# Patient Record
Sex: Female | Born: 1990 | Race: Black or African American | Hispanic: No | Marital: Single | Smoking: Former smoker
Health system: Southern US, Community
[De-identification: ages and names within clinical notes are randomized; demographics above are authoritative.]

## PROBLEM LIST (undated history)

## (undated) ENCOUNTER — Inpatient Hospital Stay (HOSPITAL_COMMUNITY): Payer: Self-pay

## (undated) DIAGNOSIS — K219 Gastro-esophageal reflux disease without esophagitis: Secondary | ICD-10-CM

## (undated) DIAGNOSIS — D649 Anemia, unspecified: Secondary | ICD-10-CM

## (undated) HISTORY — DX: Anemia, unspecified: D64.9

## (undated) HISTORY — DX: Gastro-esophageal reflux disease without esophagitis: K21.9

## (undated) HISTORY — PX: NO PAST SURGERIES: SHX2092

---
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
## 2011-12-05 ENCOUNTER — Emergency Department (HOSPITAL_COMMUNITY)
Admission: EM | Admit: 2011-12-05 | Discharge: 2011-12-05 | Disposition: A | Discharge status: Home / Self Care | Payer: Self-pay | Attending: Emergency Medicine | Admitting: Emergency Medicine

## 2011-12-05 ENCOUNTER — Encounter (HOSPITAL_COMMUNITY): Payer: Self-pay | Admitting: Nurse Practitioner

## 2011-12-05 DIAGNOSIS — M545 Low back pain, unspecified: Secondary | ICD-10-CM | POA: Insufficient documentation

## 2011-12-05 DIAGNOSIS — R51 Headache: Secondary | ICD-10-CM | POA: Insufficient documentation

## 2011-12-05 DIAGNOSIS — F17200 Nicotine dependence, unspecified, uncomplicated: Secondary | ICD-10-CM | POA: Insufficient documentation

## 2011-12-05 DIAGNOSIS — Z87440 Personal history of urinary (tract) infections: Secondary | ICD-10-CM | POA: Insufficient documentation

## 2011-12-05 LAB — URINALYSIS, ROUTINE W REFLEX MICROSCOPIC
Glucose, UA: NEGATIVE mg/dL
Protein, ur: NEGATIVE mg/dL
Specific Gravity, Urine: 1.016 (ref 1.005–1.030)
Urobilinogen, UA: 0.2 mg/dL (ref 0.0–1.0)

## 2011-12-05 LAB — URINE MICROSCOPIC-ADD ON

## 2011-12-05 MED ORDER — IBUPROFEN 400 MG PO TABS
800.0000 mg | ORAL_TABLET | Freq: Once | ORAL | Status: AC
  Administered 2011-12-05: 800 mg via ORAL
  Filled 2011-12-05: qty 2

## 2011-12-05 MED ORDER — OXYCODONE-ACETAMINOPHEN 5-325 MG PO TABS: 1.0000 | ORAL_TABLET | Freq: Four times a day (QID) | ORAL | Status: DC | PRN

## 2011-12-05 MED ORDER — CYCLOBENZAPRINE HCL 5 MG PO TABS: 5.0000 mg | ORAL_TABLET | Freq: Three times a day (TID) | ORAL | Status: DC | PRN

## 2011-12-05 MED ORDER — OXYCODONE-ACETAMINOPHEN 5-325 MG PO TABS
1.0000 | ORAL_TABLET | Freq: Once | ORAL | Status: AC
  Administered 2011-12-05: 1 via ORAL
  Filled 2011-12-05: qty 1

## 2011-12-05 MED ORDER — IBUPROFEN 600 MG PO TABS: 600.0000 mg | ORAL_TABLET | Freq: Four times a day (QID) | ORAL | Status: DC | PRN

## 2011-12-05 NOTE — ED Provider Notes (Signed)
History   This chart was scribed for Ethelda Chick, MD by Melba Coon, ED Scribe. The patient was seen in room TR10C/TR10C and the patient's care was started at 1:48PM.    CSN: [id number]  Arrival date & time 12/05/11  1306   None     Chief Complaint  Patient presents with  . Migraine  . Back Pain    (Consider location/radiation/quality/duration/timing/severity/associated sxs/prior treatment) Patient is a 21 y.o. female presenting with back pain and headaches. The history is provided by the patient. No language interpreter was used.  Back Pain  This is a new problem. The current episode started 2 days ago. The problem has not changed since onset.The pain is associated with no known injury. The pain is present in the lumbar spine. The symptoms are aggravated by bending, twisting and certain positions. Associated symptoms include headaches.  Headache  The current episode started 2 days ago. The problem occurs constantly. The problem has not changed since onset.The headache is associated with nothing. The quality of the pain is described as dull and throbbing. The pain is moderate. She has tried acetaminophen (and ibuprofen) for the symptoms. The treatment provided no relief.  She believes that she may have a UTI; back pain is similar to her past UTIs. Back pain started first then her headache. She reports nausea and emesis. She denies fever, neck pain, sore throat, rash, CP, SOB, abdominal pain, diarrhea, dysuria, bowel or bladder dysfunction, or extremity pain, edema, weakness, numbness, or tingling. No IV drug abuse. No known allergies. No other pertinent medical symptoms.  No past medical history on file.  No past surgical history on file.  No family history on file.  History  Substance Use Topics  . Smoking status: Current Every Day Smoker  . Smokeless tobacco: Not on file  . Alcohol Use: No    OB History    Grav Para Term Preterm Abortions TAB SAB Ect Mult Living              Review of Systems  Musculoskeletal: Positive for back pain.  Neurological: Positive for headaches.   10 Systems reviewed and all are negative for acute change except as noted in the HPI.   Allergies  Review of patient's allergies indicates no known allergies.  Home Medications  No current outpatient prescriptions on file.  BP 128/70  Pulse 101  Temp 98.8 F (37.1 C) (Oral)  Resp 15  SpO2 100%  Physical Exam  Nursing note and vitals reviewed. Constitutional:       Awake, alert, nontoxic appearance.  HENT:  Head: Atraumatic.  Eyes: EOM are normal. Pupils are equal, round, and reactive to light. Right eye exhibits no discharge. Left eye exhibits no discharge.  Neck: Neck supple.  Cardiovascular: Normal rate, regular rhythm and normal heart sounds.   No murmur heard. Pulmonary/Chest: Effort normal and breath sounds normal. No respiratory distress. She has no wheezes. She has no rales. She exhibits no tenderness.  Abdominal: Soft. There is no tenderness. There is no rebound.  Musculoskeletal: She exhibits tenderness (mild right paraspinal lumbar tenderness).       Baseline ROM, no obvious new focal weakness.  Neurological:       Mental status and motor strength appears baseline for patient and situation. Strength 5/5 in lower extremities and sensation is intact.  Skin: No rash noted.  Psychiatric: She has a normal mood and affect.    ED Course  Procedures (including critical care time)   COORDINATION OF  CARE:  1:51PM - pain meds and UA will be ordered for Kathleen Chapman.  2:46PM - lab results reviewed and are unremarkable. Pain meds, antiinflammatory meds, and muscle relaxants will be Rx for Kathleen Chapman. She is ready for d/c.   Labs Reviewed  URINALYSIS, ROUTINE W REFLEX MICROSCOPIC - Abnormal; Notable for the following:    APPearance CLOUDY (*)     Leukocytes, UA SMALL (*)     All other components within normal limits  URINE MICROSCOPIC-ADD ON    No results found.   1. Low back pain   2. Headache       MDM  Pt presenting with low back pain as well as headache.  Urine not c/w UTI.  No injury or midline lumbar pain to warrant xrays.  Pt treated with pain medication, given rx for ibuprofen as well as muscle relaxer.  Discharged with strict return precautions.  Pt agreeable with plan.  I personally performed the services described in this documentation, which was scribed in my presence. The recorded information has been reviewed and is accurate.       Ethelda Chick, MD 12/05/11 1515

## 2011-12-05 NOTE — ED Notes (Signed)
Pt c/o headache over past 2 days with no relief from OTC meds. Also c/o lower back pain over past week. Denies injuries. Denies Bowel/bladder changes

## 2012-10-05 ENCOUNTER — Emergency Department (HOSPITAL_COMMUNITY): Payer: Medicaid Other

## 2012-10-05 ENCOUNTER — Emergency Department (HOSPITAL_COMMUNITY)
Admission: EM | Admit: 2012-10-05 | Discharge: 2012-10-05 | Disposition: A | Discharge status: Home / Self Care | Payer: Medicaid Other | Attending: Emergency Medicine | Admitting: Emergency Medicine

## 2012-10-05 DIAGNOSIS — F17200 Nicotine dependence, unspecified, uncomplicated: Secondary | ICD-10-CM | POA: Insufficient documentation

## 2012-10-05 DIAGNOSIS — Z792 Long term (current) use of antibiotics: Secondary | ICD-10-CM | POA: Insufficient documentation

## 2012-10-05 DIAGNOSIS — S8991XA Unspecified injury of right lower leg, initial encounter: Secondary | ICD-10-CM

## 2012-10-05 DIAGNOSIS — W2209XA Striking against other stationary object, initial encounter: Secondary | ICD-10-CM | POA: Insufficient documentation

## 2012-10-05 DIAGNOSIS — S8990XA Unspecified injury of unspecified lower leg, initial encounter: Secondary | ICD-10-CM | POA: Insufficient documentation

## 2012-10-05 DIAGNOSIS — Y9229 Other specified public building as the place of occurrence of the external cause: Secondary | ICD-10-CM | POA: Insufficient documentation

## 2012-10-05 DIAGNOSIS — Y939 Activity, unspecified: Secondary | ICD-10-CM | POA: Insufficient documentation

## 2012-10-05 MED ORDER — OXYCODONE-ACETAMINOPHEN 5-325 MG PO TABS: 1.0000 | ORAL_TABLET | Freq: Four times a day (QID) | ORAL | Status: DC | PRN

## 2012-10-05 MED ORDER — NAPROXEN 375 MG PO TABS: 375.0000 mg | ORAL_TABLET | Freq: Two times a day (BID) | ORAL | Status: DC

## 2012-10-05 NOTE — ED Notes (Signed)
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  Pt states the knee has progressivly gotten stiffer.  Claims not to not be able to move the knee.  Pt states she cannot bare all of weight on rt knee. No obvious swelling or redness Pt alert oriented X4

## 2012-10-05 NOTE — ED Provider Notes (Signed)
CSN: [id number]     Arrival date & time 10/05/12  1051 History  This chart was scribed for Kathleen Polendo G. Neva Seat, PA, working with Kathleen Gaskins, MD, by Kathleen Chapman, ED Scribe. This patient was seen in room TR07C/TR07C and the patient's care was started at 11:45 PM.    Chief Complaint  Patient presents with  . Knee Pain   The history is provided by the patient. No language interpreter was used.    HPI Comments: Kathleen Chapman is a 22 y.o. female who presents to the Emergency Department complaining of sudden onset, gradually worsening, constant right knee pain which began when hitting her knee towards the floor at church 4 days ago. Pain worsens when bending. Pt has associated swelling and a gait problem. Pt denies weakness, numbeness, or any other symptoms.  Pt denies any history of gout.    No past medical history on file. No past surgical history on file. No family history on file. History  Substance Use Topics  . Smoking status: Current Every Day Smoker  . Smokeless tobacco: Not on file  . Alcohol Use: No   OB History   Grav Para Term Preterm Abortions TAB SAB Ect Mult Living                 Review of Systems  Musculoskeletal: Positive for myalgias and gait problem.  Neurological: Negative for weakness and numbness.  All other systems reviewed and are negative.    Allergies  Hydrocodone  Home Medications   Current Outpatient Rx  Name  Route  Sig  Dispense  Refill  . amoxicillin (AMOXIL) 500 MG capsule   Oral   Take 500 mg by mouth 2 (two) times daily.         . naproxen (NAPROSYN) 375 MG tablet   Oral   Take 1 tablet (375 mg total) by mouth 2 (two) times daily.   20 tablet   0   . oxyCODONE-acetaminophen (PERCOCET/ROXICET) 5-325 MG per tablet   Oral   Take 1 tablet by mouth every 6 (six) hours as needed for pain.   15 tablet   0    Triage Vitals: BP 104/76  Pulse 92  Temp(Src) 97.9 F (36.6 C) (Oral)  Resp 20  SpO2 100%  LMP  09/14/2012  Physical Exam  Nursing note and vitals reviewed. Constitutional: She is oriented to person, place, and time. She appears well-developed and well-nourished. No distress.  HENT:  Head: Normocephalic and atraumatic.  Right Ear: External ear normal.  Left Ear: External ear normal.  Nose: Nose normal.  Eyes: Conjunctivae are normal.  Neck: Neck supple.  Pulmonary/Chest: Effort normal.  Musculoskeletal: She exhibits tenderness.       Right knee: She exhibits decreased range of motion (Due to pain.) and swelling. Tenderness found.  mild suprapatellar tenderness. No redness or induration to the joint. Pedal pulses are symmetrical.   Neurological: She is alert and oriented to person, place, and time.  Skin: Skin is warm and dry. She is not diaphoretic.  Psychiatric: She has a normal mood and affect.    ED Course  Procedures (including critical care time)  DIAGNOSTIC STUDIES: Oxygen Saturation is 100% on RA, normal by my interpretation.    COORDINATION OF CARE: 11:49 PM- Pt advised of plan for treatment which includes crutches, knee sleeve and pt agrees. Referred pt to schedule an appointment with orthopedist. Will prescribe naprosyn, and percocet.   Labs Review Labs Reviewed - No data to display Imaging  Review Dg Knee Complete 4 Views Right  10/05/2012   CLINICAL DATA:  Pain post trauma  EXAM: RIGHT KNEE - COMPLETE 4+ VIEW  COMPARISON:  None.  FINDINGS: Frontal, lateral, and bilateral oblique views were obtained. There is no fracture, dislocation, or effusion. Joint spaces appear intact. No erosive change.  IMPRESSION: No abnormality noted.   Electronically Signed   By: Bretta Bang   On: 10/05/2012 11:30    MDM   1. Knee injury, right, initial encounter    22 y.o.Kathleen Chapman's evaluation in the Emergency Department is complete. It has been determined that no acute conditions requiring further emergency intervention are present at this time. The patient/guardian  have been advised of the diagnosis and plan. We have discussed signs and symptoms that warrant return to the ED, such as changes or worsening in symptoms.  Vital signs are stable at discharge. Filed Vitals:   10/05/12 1057  BP: 104/76  Pulse: 92  Temp: 97.9 F (36.6 C)  Resp: 20    Patient/guardian has voiced understanding and agreed to follow-up with the PCP or specialist.  I personally performed the services described in this documentation, which was scribed in my presence. The recorded information has been reviewed and is accurate.    Kathleen Matas, PA-C 10/05/12 1229

## 2012-10-06 NOTE — ED Provider Notes (Signed)
Medical screening examination/treatment/procedure(s) were performed by non-physician practitioner and as supervising physician I was immediately available for consultation/collaboration.   Joya Gaskins, MD 10/06/12 [phone]

## 2013-05-24 ENCOUNTER — Emergency Department (HOSPITAL_COMMUNITY): Payer: Medicaid Other

## 2013-05-24 ENCOUNTER — Encounter (HOSPITAL_COMMUNITY): Payer: Self-pay | Admitting: Emergency Medicine

## 2013-05-24 ENCOUNTER — Emergency Department (HOSPITAL_COMMUNITY)
Admission: EM | Admit: 2013-05-24 | Discharge: 2013-05-24 | Disposition: A | Discharge status: Home / Self Care | Payer: Medicaid Other | Attending: Emergency Medicine | Admitting: Emergency Medicine

## 2013-05-24 DIAGNOSIS — F17200 Nicotine dependence, unspecified, uncomplicated: Secondary | ICD-10-CM | POA: Insufficient documentation

## 2013-05-24 DIAGNOSIS — IMO0002 Reserved for concepts with insufficient information to code with codable children: Secondary | ICD-10-CM | POA: Insufficient documentation

## 2013-05-24 DIAGNOSIS — Z3202 Encounter for pregnancy test, result negative: Secondary | ICD-10-CM | POA: Insufficient documentation

## 2013-05-24 DIAGNOSIS — Y929 Unspecified place or not applicable: Secondary | ICD-10-CM | POA: Insufficient documentation

## 2013-05-24 DIAGNOSIS — Y939 Activity, unspecified: Secondary | ICD-10-CM | POA: Insufficient documentation

## 2013-05-24 DIAGNOSIS — S63509A Unspecified sprain of unspecified wrist, initial encounter: Secondary | ICD-10-CM | POA: Insufficient documentation

## 2013-05-24 LAB — POC URINE PREG, ED: Preg Test, Ur: NEGATIVE

## 2013-05-24 MED ORDER — TRAMADOL HCL 50 MG PO TABS: 50.0000 mg | ORAL_TABLET | Freq: Four times a day (QID) | ORAL | Status: DC | PRN

## 2013-05-24 NOTE — ED Notes (Addendum)
Rt wrist pain after hitting a wall yesterday  Swelling and painful has good pulse neuro and can wiggle fingers may need preg test

## 2013-05-24 NOTE — ED Provider Notes (Signed)
CSN: [id number]     Arrival date & time 05/24/13  1405 History   First MD Initiated Contact with Patient 05/24/13 1430    This chart was scribed for Kathleen Chapman Kadeidra Coryell PA-C, a non-physician practitioner working with No att. providers found by Lewanda RifeAlexandra Hurtado, ED Scribe. This patient was seen in room TR07C/TR07C and the patient's care was started at 6:32 AM      Chief Complaint  Patient presents with  . Wrist Pain     (Consider location/radiation/quality/duration/timing/severity/associated sxs/prior Treatment) The history is provided by the patient. No language interpreter was used.   HPI Comments: Kathleen Chapman is a 23 y.o. female who presents to the Emergency Department complaining of constant right hand pain onset yesterday after she intentionally punched a wall. Describes pain as moderate in severity. Reports pain is exacerbated by touch and movement. Reports trying mother's velcro wrist splint with mild relief of symptoms. Denies associated fever, numbness, weakness, and other injuries.  History reviewed. No pertinent past medical history. History reviewed. No pertinent past surgical history. No family history on file. History  Substance Use Topics  . Smoking status: Current Every Day Smoker  . Smokeless tobacco: Not on file  . Alcohol Use: No   OB History   Grav Para Term Preterm Abortions TAB SAB Ect Mult Living                 Review of Systems  Constitutional: Negative for fever.  Musculoskeletal: Positive for myalgias.  Skin: Negative for wound.  Neurological: Negative for numbness.      Allergies  Hydrocodone  Home Medications   Prior to Admission medications   Not on File   BP 141/74  Pulse 77  Temp(Src) 98.6 F (37 C) (Oral)  Resp 16  SpO2 99% Physical Exam  Nursing note and vitals reviewed. Constitutional: She is oriented to person, place, and time. She appears well-developed and well-nourished. No distress.  HENT:  Head: Normocephalic and  atraumatic.  Eyes: EOM are normal.  Neck: Neck supple.  Cardiovascular: Normal rate.   Pulmonary/Chest: Effort normal. No respiratory distress.  Musculoskeletal: Normal range of motion.       Right wrist: She exhibits tenderness. She exhibits normal range of motion, no effusion, no crepitus, no deformity and no laceration.  Right wrist: Normal strength and movement to all five fingers with no pain of palpation. Decreased ROM of right wrist secondary to pain. Mild TTP of ulnar head. Normal sensation. Normal radial pulse. No abrasion, and no laceration.   Neurological: She is alert and oriented to person, place, and time.  Skin: Skin is warm and dry.  Psychiatric: She has a normal mood and affect. Her behavior is normal.    ED Course  Procedures (including critical care time) COORDINATION OF CARE:  Nursing notes reviewed. Vital signs reviewed. Initial pt interview and examination performed.   Filed Vitals:   05/24/13 1418 05/24/13 1419 05/24/13 1611  BP:  135/71 141/74  Pulse: 80  77  Temp: 98.6 F (37 C)    TempSrc: Oral    Resp: 16  16  SpO2: 100%  99%    6:32 AM-Discussed work up plan with pt at bedside, which includes  Orders Placed This Encounter  Procedures  . DG Hand Complete Right    Standing Status: Standing     Number of Occurrences: 1     Standing Expiration Date:     Order Specific Question:  Reason for exam:    Answer:  WRIST  PAIN  . POC Urine Pregnancy, ED ([facility])    Standing Status: Standing     Number of Occurrences: 1     Standing Expiration Date:   . Pt agrees with plan.   Treatment plan initiated:Medications - No data to display   Initial diagnostic testing ordered.       Labs Review Labs Reviewed  POC URINE PREG, ED    Imaging Review Dg Hand Complete Right  05/24/2013   CLINICAL DATA:  Right hand pain following traumatic injury  EXAM: RIGHT HAND - COMPLETE 3+ VIEW  COMPARISON:  None.  FINDINGS: There is no evidence of  fracture or dislocation. There is no evidence of arthropathy or other focal bone abnormality. Soft tissues are unremarkable.  IMPRESSION: No acute abnormality noted.   Electronically Signed   By: Alcide CleverMark  Lukens M.D.   On: 05/24/2013 15:43   6:32 AM Nursing Notes Reviewed/ Care Coordinated Applicable Imaging Reviewed and incorporated into ED treatment Discussed results and treatment plan with pt. Pt demonstrates understanding and agrees with plan.   EKG Interpretation None      MDM   Final diagnoses:  Wrist sprain  no sign of boxers fracture or any abnormality, dislocation or hematoma. Her exam is mild. I have recommended her to continue to use her moms wrist splint, ice and rest her wrist/hand for two weeks.   Referral to Ortho given and Rx for Ultram.  22 y.o.Matrice Chapman's evaluation in the Emergency Department is complete. It has been determined that no acute conditions requiring further emergency intervention are present at this time. The patient/guardian have been advised of the diagnosis and plan. We have discussed signs and symptoms that warrant return to the ED, such as changes or worsening in symptoms.  Vital signs are stable at discharge. Filed Vitals:   05/24/13 1611  BP: 141/74  Pulse: 77  Temp:   Resp: 16    Patient/guardian has voiced understanding and agreed to follow-up with the PCP or specialist.    Kathleen Chapman G Stana Bayon, PA-C 05/25/13 [phone]

## 2013-05-24 NOTE — Discharge Instructions (Signed)
Wrist Pain Wrist injuries are frequent in adults and children. A sprain is an injury to the ligaments that hold your bones together. A strain is an injury to muscle or muscle cord-like structures (tendons) from stretching or pulling. Generally, when wrists are moderately tender to touch following a fall or injury, a break in the bone (fracture) may be present. Most wrist sprains or strains are better in 3 to 5 days, but complete healing may take several weeks. HOME CARE INSTRUCTIONS   Put ice on the injured area.  Put ice in a plastic bag.  Place a towel between your skin and the bag.  Leave the ice on for 15-20 minutes, 03-04 times a day, for the first 2 days.  Keep your arm raised above the level of your heart whenever possible to reduce swelling and pain.  Rest the injured area for at least 48 hours or as directed by your caregiver.  If a splint or elastic bandage has been applied, use it for as long as directed by your caregiver or until seen by a caregiver for a follow-up exam.  Only take over-the-counter or prescription medicines for pain, discomfort, or fever as directed by your caregiver.  Keep all follow-up appointments. You may need to follow up with a specialist or have follow-up X-rays. Improvement in pain level is not a guarantee that you did not fracture a bone in your wrist. The only way to determine whether or not you have a broken bone is by X-ray. SEEK IMMEDIATE MEDICAL CARE IF:   Your fingers are swollen, very red, white, or cold and blue.  Your fingers are numb or tingling.  You have increasing pain.  You have difficulty moving your fingers. MAKE SURE YOU:   Understand these instructions.  Will watch your condition.  Will get help right away if you are not doing well or get worse. Document Released: 09/30/2004 Document Revised: 03/15/2011 Document Reviewed: 02/11/2010 [facility] Patient Information 2014 [place]Care, [place].  Strain A strain is an injury to a  muscle or the tissue that connects muscles to bones (tendon). In a strain injury, the muscle or tendon is either stretched or torn. Muscles are more susceptible to strains if they cross two joints, such as:  Hamstrings.  Quadriceps.  Calves.  Biceps. There are three categories of strains:  A first-degree strain is a small tear in the muscle. There is no lengthening of the muscle, but pain may be present with contraction of the muscle.  A second-degree strain is a small tear in the muscle accompanied by lengthening of the muscle. Muscles with a second-degree strain are still able to function.  A third-degree strain is a complete tear of the muscle. Muscles with a third-degree strain cannot function properly. Strains often have bleeding and bruising within the muscle. SYMPTOMS   Pain, tenderness, redness or bruising, and swelling in the area of injury.  Loss of normal mobility of the injured joint. CAUSES  A sudden force exerted on a muscle or tendon that it cannot withstand usually causes strains. This may be due to a sudden overload of a contracted muscle, overuse, or sudden increase or change in activity.  RISK INCREASES WITH:  Trauma.  Poor strength and flexibility.  Failure to warm-up properly before activity.  Return to activity before healing is complete. PREVENTION  Warm-up and stretch properly before and activity.  Maintain physical fitness:  Joint flexibility.  Muscle strength.  Endurance and conditioning.  Strengthen weak muscles with exercises to prevent  recurrence. PROGNOSIS  If treated properly, strains are usually curable. The time it takes to recover is related to the severity of the injury and usually varies from 2 to 8 weeks. RELATED COMPLICATIONS   Re-injury or recurrence of symptoms, permanent weakness.  Joint stiffness if the strain is severe and rehabilitation is incomplete.  Delayed healing or resolution of symptoms if sports are resumed  before rehabilitation is complete.  Excessive bleeding into muscle, especially if taking anti-inflammatory medicines. This can lead to delayed recovery and injury to nerves, muscle, and blood vessels; this is an emergency. TREATMENT  Treatment initially involves ice and medicine to help reduce pain and inflammation. Use of the affected muscle should be limited by a:  Brace.  Elastic bandage wrapping.  Splint.  Cast.  Sling. Strengthening and stretching exercises may be necessary after immobilization to prevent joint stiffness. These exercises may be completed at home or with a therapist. If the tendon is torn, then surgery may be necessary to repair it.  MEDICATION   Avoid aspirin or ibuprofen in the first 48 hours after the injury. These medicines may increase the tendency to bleed. During this time, you may take pain relievers, such as acetaminophen, that do not affect bleeding.  After the first 48 hours, if pain medicine is necessary, then nonsteroidal anti-inflammatory medicines, such as aspirin and ibuprofen, or other minor pain relievers, such as acetaminophen, are often recommended.  Do not take pain medicine within 7 days before surgery.  Prescription pain relievers may be prescribed. Use only as directed and only as much as you need  Ointments applied to the skin may be helpful. HEAT AND COLD  Cold treatment (icing) relieves pain and reduces inflammation. Cold treatment should be applied for 10 to 15 minutes every 2 to 3 hours for inflammation and pain and immediately after any activity that aggravates your symptoms. Use ice packs or massage the area with a piece of ice (ice massage).  Heat treatment may be used prior to performing the stretching and strengthening activities prescribed by your caregiver, physical therapist, or athletic trainer. Use a heat pack or soak your injury in warm water. SEEK MEDICAL CARE IF:   Symptoms get worse or do not improve despite  treatment.  Pain becomes intolerable.  You experience numbness or tingling.  Toes or fingernails become cold or develop a blue, gray, or dusky color.  New, unexplained symptoms develop (drugs used in treatment may produce side effects). Document Released: 12/21/2004 Document Revised: 03/15/2011 Document Reviewed: 04/04/2008 [facility] Patient Information 2014 [place], [place].

## 2013-05-25 NOTE — ED Provider Notes (Signed)
Medical screening examination/treatment/procedure(s) were performed by non-physician practitioner and as supervising physician I was immediately available for consultation/collaboration.   EKG Interpretation None        Samarth Ogle J. Keniel Ralston, MD 05/25/13 0832 

## 2013-09-20 ENCOUNTER — Encounter (HOSPITAL_COMMUNITY): Payer: Self-pay | Admitting: Emergency Medicine

## 2013-09-20 ENCOUNTER — Emergency Department (HOSPITAL_COMMUNITY): Payer: Medicaid Other

## 2013-09-20 ENCOUNTER — Emergency Department (HOSPITAL_COMMUNITY)
Admission: EM | Admit: 2013-09-20 | Discharge: 2013-09-20 | Disposition: A | Discharge status: Home / Self Care | Payer: Medicaid Other | Attending: Emergency Medicine | Admitting: Emergency Medicine

## 2013-09-20 DIAGNOSIS — F17200 Nicotine dependence, unspecified, uncomplicated: Secondary | ICD-10-CM | POA: Diagnosis not present

## 2013-09-20 DIAGNOSIS — Z3202 Encounter for pregnancy test, result negative: Secondary | ICD-10-CM | POA: Diagnosis not present

## 2013-09-20 DIAGNOSIS — Z79899 Other long term (current) drug therapy: Secondary | ICD-10-CM | POA: Diagnosis not present

## 2013-09-20 DIAGNOSIS — N390 Urinary tract infection, site not specified: Secondary | ICD-10-CM | POA: Diagnosis not present

## 2013-09-20 DIAGNOSIS — R1031 Right lower quadrant pain: Secondary | ICD-10-CM | POA: Insufficient documentation

## 2013-09-20 DIAGNOSIS — J069 Acute upper respiratory infection, unspecified: Secondary | ICD-10-CM | POA: Diagnosis not present

## 2013-09-20 LAB — URINE MICROSCOPIC-ADD ON

## 2013-09-20 LAB — COMPREHENSIVE METABOLIC PANEL
ALBUMIN: 3.5 g/dL (ref 3.5–5.2)
ALK PHOS: 81 U/L (ref 39–117)
ALT: 15 U/L (ref 0–35)
AST: 16 U/L (ref 0–37)
Anion gap: 14 (ref 5–15)
BILIRUBIN TOTAL: 0.3 mg/dL (ref 0.3–1.2)
BUN: 8 mg/dL (ref 6–23)
CHLORIDE: 106 meq/L (ref 96–112)
CO2: 23 meq/L (ref 19–32)
CREATININE: 0.86 mg/dL (ref 0.50–1.10)
Calcium: 8.9 mg/dL (ref 8.4–10.5)
GFR calc Af Amer: >90 mL/min (ref >90)
Glucose, Bld: 87 mg/dL (ref 70–99)
POTASSIUM: 4 meq/L (ref 3.7–5.3)
SODIUM: 143 meq/L (ref 137–147)
Total Protein: 6.8 g/dL (ref 6.0–8.3)

## 2013-09-20 LAB — URINALYSIS, ROUTINE W REFLEX MICROSCOPIC
BILIRUBIN URINE: NEGATIVE
Glucose, UA: NEGATIVE mg/dL
Hgb urine dipstick: NEGATIVE
KETONES UR: NEGATIVE mg/dL
NITRITE: NEGATIVE
PH: 5.5 (ref 5.0–8.0)
PROTEIN: NEGATIVE mg/dL
Specific Gravity, Urine: 1.02 (ref 1.005–1.030)
Urobilinogen, UA: 0.2 mg/dL (ref 0.0–1.0)

## 2013-09-20 LAB — CBC WITH DIFFERENTIAL/PLATELET
BASOS ABS: 0 10*3/uL (ref 0.0–0.1)
Basophils Relative: 0 % (ref 0–1)
EOS ABS: 0.4 10*3/uL (ref 0.0–0.7)
EOS PCT: 4 % (ref 0–5)
HEMATOCRIT: 41.4 % (ref 36.0–46.0)
Hemoglobin: 14.1 g/dL (ref 12.0–15.0)
Lymphocytes Relative: 29 % (ref 12–46)
Lymphs Abs: 2.9 10*3/uL (ref 0.7–4.0)
MCH: 31.3 pg (ref 26.0–34.0)
MCHC: 34.1 g/dL (ref 30.0–36.0)
MCV: 91.8 fL (ref 78.0–100.0)
MONO ABS: 0.5 10*3/uL (ref 0.1–1.0)
Monocytes Relative: 5 % (ref 3–12)
Neutro Abs: 6.1 10*3/uL (ref 1.7–7.7)
Neutrophils Relative %: 62 % (ref 43–77)
PLATELETS: 303 10*3/uL (ref 150–400)
RBC: 4.51 MIL/uL (ref 3.87–5.11)
RDW: 13.3 % (ref 11.5–15.5)
WBC: 10 10*3/uL (ref 4.0–10.5)

## 2013-09-20 LAB — LIPASE, BLOOD: LIPASE: 19 U/L (ref 11–59)

## 2013-09-20 LAB — PREGNANCY, URINE: Preg Test, Ur: NEGATIVE

## 2013-09-20 LAB — HCG, SERUM, QUALITATIVE: Preg, Serum: NEGATIVE

## 2013-09-20 MED ORDER — TRAMADOL HCL 50 MG PO TABS: 50.0000 mg | ORAL_TABLET | Freq: Four times a day (QID) | ORAL | Status: DC | PRN

## 2013-09-20 MED ORDER — GUAIFENESIN 100 MG/5ML PO LIQD: 100.0000 mg | ORAL | Status: DC | PRN

## 2013-09-20 MED ORDER — TRAMADOL HCL 50 MG PO TABS
50.0000 mg | ORAL_TABLET | Freq: Once | ORAL | Status: AC
  Administered 2013-09-20: 50 mg via ORAL
  Filled 2013-09-20: qty 1

## 2013-09-20 MED ORDER — GUAIFENESIN 100 MG/5ML PO SOLN
5.0000 mL | Freq: Once | ORAL | Status: AC
  Administered 2013-09-20: 100 mg via ORAL
  Filled 2013-09-20: qty 5

## 2013-09-20 MED ORDER — CEPHALEXIN 500 MG PO CAPS: 500.0000 mg | ORAL_CAPSULE | Freq: Two times a day (BID) | ORAL | Status: DC

## 2013-09-20 NOTE — ED Notes (Signed)
Pt here for left flank pain radiating to the front lower abd. Denies any urinary symptoms. Denies any vaginal pain, bleeding, odor or itching. Pt coughing with brown and yellow mucus production. Has had a cold for a while and got another one right after. Reports her chest hurts from coughing.

## 2013-09-20 NOTE — ED Provider Notes (Signed)
CSN: [id number]     Arrival date & time 09/20/13  7829 History   First MD Initiated Contact with Patient 09/20/13 [id number]     Chief Complaint  Patient presents with  . Flank Pain  . Abdominal Pain     (Consider location/radiation/quality/duration/timing/severity/associated sxs/prior Treatment) HPI Comments: Patient is a [id number] 23 year old female presenting to the emergency department for multiple complaints. Patient's first complaint is one week history of waxing and waning left flank pain with radiation to right lower quadrant with associated urinary frequency. No alleviating or aggravating factors. Patient states feels like she cannot get comfortable position. Patient's second complaint is 2-3 weeks of dark yellowish brown mucus production with cough with nasal congestion and rhinorrhea. Patient endorses post tussive chest tightness. Endorses subjective fevers and chills. Denies any vaginal bleeding or discharge, vaginal pain, diarrhea or constipation, hematuria. Full menstrual cycle was in June, patient states that she has had very light bleeding for 2-3 days each month since. Last mental period was last week.  Patient is a 23 y.o. female presenting with flank pain and abdominal pain.  Flank Pain Associated symptoms include abdominal pain, congestion and coughing. Pertinent negatives include no nausea or vomiting.  Abdominal Pain Associated symptoms: cough   Associated symptoms: no constipation, no diarrhea, no nausea and no vomiting     History reviewed. No pertinent past medical history. History reviewed. No pertinent past surgical history. No family history on file. History  Substance Use Topics  . Smoking status: Current Every Day Smoker  . Smokeless tobacco: Not on file  . Alcohol Use: No   OB History   Grav Para Term Preterm Abortions TAB SAB Ect Mult Living                 Review of Systems  HENT: Positive for congestion and rhinorrhea.   Respiratory: Positive for cough  and chest tightness.   Gastrointestinal: Positive for abdominal pain. Negative for nausea, vomiting, diarrhea and constipation.  Genitourinary: Positive for frequency and flank pain.  All other systems reviewed and are negative.     Allergies  Hydrocodone  Home Medications   Prior to Admission medications   Medication Sig Start Date End Date Taking? Authorizing Provider  cephALEXin (KEFLEX) 500 MG capsule Take 1 capsule (500 mg total) by mouth 2 (two) times daily. 09/20/13   Korianna Washer L Oberia Beaudoin, PA-C  guaiFENesin (ROBITUSSIN) 100 MG/5ML liquid Take 5-10 mLs (100-200 mg total) by mouth every 4 (four) hours as needed for cough. 09/20/13   Makaylee Spielberg L Marshon Bangs, PA-C  traMADol (ULTRAM) 50 MG tablet Take 1 tablet (50 mg total) by mouth every 6 (six) hours as needed. 05/24/13   Tiffany Irine Seal, PA-C  traMADol (ULTRAM) 50 MG tablet Take 1 tablet (50 mg total) by mouth every 6 (six) hours as needed. 09/20/13   Shai Mckenzie L Raymel Cull, PA-C   BP 111/54  Pulse 74  Temp(Src) 98 F (36.7 C) (Oral)  Resp 16  Ht 5' (1.524 m)  Wt 144 lb (65.318 kg)  BMI 28.12 kg/m2  SpO2 100%  LMP 09/10/2013 Physical Exam  Nursing note and vitals reviewed. Constitutional: She is oriented to person, place, and time. She appears well-developed and well-nourished. No distress.  HENT:  Head: Normocephalic and atraumatic.  Right Ear: External ear normal.  Left Ear: External ear normal.  Nose: Nose normal.  Mouth/Throat: Oropharynx is clear and moist.  Eyes: Conjunctivae are normal.  Neck: Normal range of motion. Neck supple.  Cardiovascular: Normal rate, regular  rhythm and normal heart sounds.   Coughing on examination, heart rate increases with coughing.   Pulmonary/Chest: Effort normal and breath sounds normal. No respiratory distress. She has no wheezes. She has no rales.  Abdominal: Soft. Normal appearance. There is no rigidity, no rebound, no guarding and no CVA tenderness.    Musculoskeletal: Normal  range of motion.       Back:  Neurological: She is alert and oriented to person, place, and time.  Skin: Skin is warm and dry. She is not diaphoretic.  Psychiatric: She has a normal mood and affect.    ED Course  Procedures (including critical care time) Medications  traMADol (ULTRAM) tablet 50 mg (not administered)  guaiFENesin (ROBITUSSIN) 100 MG/5ML solution 100 mg (not administered)    Labs Review Labs Reviewed  URINALYSIS, ROUTINE W REFLEX MICROSCOPIC - Abnormal; Notable for the following:    APPearance CLOUDY (*)    Leukocytes, UA SMALL (*)    All other components within normal limits  URINE MICROSCOPIC-ADD ON - Abnormal; Notable for the following:    Squamous Epithelial / LPF FEW (*)    All other components within normal limits  LIPASE, BLOOD  COMPREHENSIVE METABOLIC PANEL  CBC WITH DIFFERENTIAL  PREGNANCY, URINE  HCG, SERUM, QUALITATIVE    Imaging Review Dg Chest 2 View  09/20/2013   CLINICAL DATA:  Cough and fever.  Possible pneumonia.  EXAM: CHEST  2 VIEW  COMPARISON:  None.  FINDINGS: The cardiomediastinal silhouette is within normal limits. The lungs are slightly hypoinflated and clear. There is no evidence of pleural effusion or pneumothorax. No acute osseous abnormality is identified.  IMPRESSION: No active cardiopulmonary disease.   Electronically Signed   By: Sebastian Ache   On: 09/20/2013 11:09     EKG Interpretation None      MDM   Final diagnoses:  URI (upper respiratory infection)  UTI (lower urinary tract infection)    Filed Vitals:   09/20/13 1230  BP: 111/54  Pulse: 74  Temp:   Resp: 16    Afebrile, NAD, non-toxic appearing, AAOx4.   1) URI: Pt CXR negative for acute infiltrate. Patients symptoms are consistent with URI, likely viral etiology. Discussed that antibiotics are not indicated for viral infections. Pt will be discharged with symptomatic treatment.  Verbalizes understanding and is agreeable with plan. Pt is hemodynamically  stable & in NAD prior to dc.  2) UTI: Pt has been diagnosed with a UTI. Pt is afebrile, no CVA tenderness, normotensive, and denies N/V. Pt to be dc home with antibiotics and instructions to follow up with PCP if symptoms persist.  Patient is stable at time of discharge    Jeannetta Ellis, PA-C 09/20/13 1342

## 2013-09-20 NOTE — ED Notes (Signed)
Jenn, PA at the bedside.  

## 2013-09-20 NOTE — Discharge Instructions (Signed)
Please follow up with your primary care physician in 1-2 days. If you do not have one [facility]. Please take pain medication and/or muscle relaxants as prescribed and as needed for pain. Please do not drive on narcotic pain medication or on muscle relaxants. Please take your antibiotic until completion. Please read all discharge instructions and return precautions.   Urinary Tract Infection Urinary tract infections (UTIs) can develop anywhere along your urinary tract. Your urinary tract is your body's drainage system for removing wastes and extra water. Your urinary tract includes two kidneys, two ureters, a bladder, and a urethra. Your kidneys are a pair of bean-shaped organs. Each kidney is about the size of your fist. They are located below your ribs, one on each side of your spine. CAUSES Infections are caused by microbes, which are microscopic organisms, including fungi, viruses, and bacteria. These organisms are so small that they can only be seen through a microscope. Bacteria are the microbes that most commonly cause UTIs. SYMPTOMS  Symptoms of UTIs may vary by age and gender of the patient and by the location of the infection. Symptoms in young women typically include a frequent and intense urge to urinate and a painful, burning feeling in the bladder or urethra during urination. Older women and men are more likely to be tired, shaky, and weak and have muscle aches and abdominal pain. A fever may mean the infection is in your kidneys. Other symptoms of a kidney infection include pain in your back or sides below the ribs, nausea, and vomiting. DIAGNOSIS To diagnose a UTI, your caregiver will ask you about your symptoms. Your caregiver also will ask to provide a urine sample. The urine sample will be tested for bacteria and white blood cells. White blood cells are made by your body to help fight infection. TREATMENT  Typically, UTIs can be  treated with medication. Because most UTIs are caused by a bacterial infection, they usually can be treated with the use of antibiotics. The choice of antibiotic and length of treatment depend on your symptoms and the type of bacteria causing your infection. HOME CARE INSTRUCTIONS  If you were prescribed antibiotics, take them exactly as your caregiver instructs you. Finish the medication even if you feel better after you have only taken some of the medication.  Drink enough water and fluids to keep your urine clear or pale yellow.  Avoid caffeine, tea, and carbonated beverages. They tend to irritate your bladder.  Empty your bladder often. Avoid holding urine for long periods of time.  Empty your bladder before and after sexual intercourse.  After a bowel movement, women should cleanse from front to back. Use each tissue only once. SEEK MEDICAL CARE IF:   You have back pain.  You develop a fever.  Your symptoms do not begin to resolve within 3 days. SEEK IMMEDIATE MEDICAL CARE IF:   You have severe back pain or lower abdominal pain.  You develop chills.  You have nausea or vomiting.  You have continued burning or discomfort with urination. MAKE SURE YOU:   Understand these instructions.  Will watch your condition.  Will get help right away if you are not doing well or get worse. Document Released: 09/30/2004 Document Revised: 06/22/2011 Document Reviewed: 01/29/2011 [facility] Patient Information 2015 [place], [place]. This information is not intended to replace advice given to you by your health care provider. Make sure you discuss any questions you have with your  health care provider.  Upper Respiratory Infection, Adult An upper respiratory infection (URI) is also known as the common cold. It is often caused by a type of germ (virus). Colds are easily spread (contagious). You can pass it to others by kissing, coughing, sneezing, or drinking out of the same glass. Usually, you  get better in 1 or 2 weeks.  HOME CARE   Only take medicine as told by your doctor.  Use a warm mist humidifier or breathe in steam from a hot shower.  Drink enough water and fluids to keep your pee (urine) clear or pale yellow.  Get plenty of rest.  Return to work when your temperature is back to normal or as told by your doctor. You may use a face mask and wash your hands to stop your cold from spreading. GET HELP RIGHT AWAY IF:   After the first few days, you feel you are getting worse.  You have questions about your medicine.  You have chills, shortness of breath, or brown or red spit (mucus).  You have yellow or brown snot (nasal discharge) or pain in the face, especially when you bend forward.  You have a fever, puffy (swollen) neck, pain when you swallow, or white spots in the back of your throat.  You have a bad headache, ear pain, sinus pain, or chest pain.  You have a high-pitched whistling sound when you breathe in and out (wheezing).  You have a lasting cough or cough up blood.  You have sore muscles or a stiff neck. MAKE SURE YOU:   Understand these instructions.  Will watch your condition.  Will get help right away if you are not doing well or get worse. Document Released: 06/09/2007 Document Revised: 03/15/2011 Document Reviewed: 03/28/2013 [facility] Patient Information 2015 [place], [place]. This information is not intended to replace advice given to you by your health care provider. Make sure you discuss any questions you have with your health care provider.

## 2013-09-21 NOTE — ED Provider Notes (Signed)
Medical screening examination/treatment/procedure(s) were performed by non-physician practitioner and as supervising physician I was immediately available for consultation/collaboration.   EKG Interpretation None        Beverlee Wilmarth F Ronelle Smallman, MD 09/21/13 1049 

## 2014-07-06 ENCOUNTER — Emergency Department (HOSPITAL_COMMUNITY)
Admission: EM | Admit: 2014-07-06 | Discharge: 2014-07-06 | Disposition: A | Discharge status: Home / Self Care | Payer: Medicaid Other | Attending: Emergency Medicine | Admitting: Emergency Medicine

## 2014-07-06 ENCOUNTER — Emergency Department (HOSPITAL_COMMUNITY): Payer: Medicaid Other

## 2014-07-06 ENCOUNTER — Encounter (HOSPITAL_COMMUNITY): Payer: Self-pay | Admitting: *Deleted

## 2014-07-06 DIAGNOSIS — Z3202 Encounter for pregnancy test, result negative: Secondary | ICD-10-CM | POA: Insufficient documentation

## 2014-07-06 DIAGNOSIS — R1110 Vomiting, unspecified: Secondary | ICD-10-CM | POA: Diagnosis not present

## 2014-07-06 DIAGNOSIS — R102 Pelvic and perineal pain: Secondary | ICD-10-CM | POA: Diagnosis not present

## 2014-07-06 DIAGNOSIS — Z79899 Other long term (current) drug therapy: Secondary | ICD-10-CM | POA: Diagnosis not present

## 2014-07-06 DIAGNOSIS — Z720 Tobacco use: Secondary | ICD-10-CM | POA: Insufficient documentation

## 2014-07-06 DIAGNOSIS — R1031 Right lower quadrant pain: Secondary | ICD-10-CM | POA: Diagnosis present

## 2014-07-06 LAB — URINALYSIS, ROUTINE W REFLEX MICROSCOPIC
Bilirubin Urine: NEGATIVE
Glucose, UA: NEGATIVE mg/dL
HGB URINE DIPSTICK: NEGATIVE
KETONES UR: NEGATIVE mg/dL
Leukocytes, UA: NEGATIVE
Nitrite: NEGATIVE
PH: 6.5 (ref 5.0–8.0)
PROTEIN: NEGATIVE mg/dL
Specific Gravity, Urine: 1.003 — ABNORMAL LOW (ref 1.005–1.030)
Urobilinogen, UA: 0.2 mg/dL (ref 0.0–1.0)

## 2014-07-06 LAB — CBC WITH DIFFERENTIAL/PLATELET
BASOS PCT: 0 % (ref 0–1)
Basophils Absolute: 0 10*3/uL (ref 0.0–0.1)
EOS PCT: 2 % (ref 0–5)
Eosinophils Absolute: 0.3 10*3/uL (ref 0.0–0.7)
HEMATOCRIT: 41.4 % (ref 36.0–46.0)
Hemoglobin: 14.4 g/dL (ref 12.0–15.0)
LYMPHS ABS: 3.7 10*3/uL (ref 0.7–4.0)
Lymphocytes Relative: 28 % (ref 12–46)
MCH: 31.6 pg (ref 26.0–34.0)
MCHC: 34.8 g/dL (ref 30.0–36.0)
MCV: 90.8 fL (ref 78.0–100.0)
Monocytes Absolute: 0.6 10*3/uL (ref 0.1–1.0)
Monocytes Relative: 5 % (ref 3–12)
Neutro Abs: 8.5 10*3/uL — ABNORMAL HIGH (ref 1.7–7.7)
Neutrophils Relative %: 65 % (ref 43–77)
PLATELETS: 233 10*3/uL (ref 150–400)
RBC: 4.56 MIL/uL (ref 3.87–5.11)
RDW: 13.5 % (ref 11.5–15.5)
WBC: 13.2 10*3/uL — ABNORMAL HIGH (ref 4.0–10.5)

## 2014-07-06 LAB — COMPREHENSIVE METABOLIC PANEL
ALT: 22 U/L (ref 14–54)
AST: 22 U/L (ref 15–41)
Albumin: 4.4 g/dL (ref 3.5–5.0)
Alkaline Phosphatase: 70 U/L (ref 38–126)
Anion gap: 9 (ref 5–15)
BUN: 8 mg/dL (ref 6–20)
CALCIUM: 9.2 mg/dL (ref 8.9–10.3)
CHLORIDE: 104 mmol/L (ref 101–111)
CO2: 22 mmol/L (ref 22–32)
Creatinine, Ser: 0.96 mg/dL (ref 0.44–1.00)
GFR calc Af Amer: >60 mL/min (ref >60)
GFR calc non Af Amer: >60 mL/min (ref >60)
GLUCOSE: 95 mg/dL (ref 65–99)
Potassium: 4.2 mmol/L (ref 3.5–5.1)
SODIUM: 135 mmol/L (ref 135–145)
Total Bilirubin: 0.5 mg/dL (ref 0.3–1.2)
Total Protein: 7.6 g/dL (ref 6.5–8.1)

## 2014-07-06 LAB — WET PREP, GENITAL
Clue Cells Wet Prep HPF POC: NONE SEEN
Trich, Wet Prep: NONE SEEN
WBC, Wet Prep HPF POC: NONE SEEN
Yeast Wet Prep HPF POC: NONE SEEN

## 2014-07-06 LAB — I-STAT BETA HCG BLOOD, ED (MC, WL, AP ONLY): I-stat hCG, quantitative: <5 m[IU]/mL (ref <5)

## 2014-07-06 LAB — POC URINE PREG, ED: Preg Test, Ur: NEGATIVE

## 2014-07-06 NOTE — ED Notes (Signed)
Patient transported to Ultrasound 

## 2014-07-06 NOTE — ED Provider Notes (Signed)
CSN: [id number]     Arrival date & time 07/06/14  4098 History   First MD Initiated Contact with Patient 07/06/14 [phone]     Chief Complaint  Patient presents with  . Abdominal Pain     (Consider location/radiation/quality/duration/timing/severity/associated sxs/prior Treatment) Patient is a 24 y.o. female presenting with abdominal pain. The history is provided by the patient.  Abdominal Pain Pain location:  RLQ Pain quality: aching and cramping   Pain radiates to:  Does not radiate Pain severity:  Mild Onset quality:  Gradual Duration:  2 weeks Timing:  Intermittent Progression:  Unchanged Chronicity:  New Associated symptoms: vomiting (2 weeks)   Associated symptoms: no cough, no fever and no shortness of breath     History reviewed. No pertinent past medical history. History reviewed. No pertinent past surgical history. No family history on file. History  Substance Use Topics  . Smoking status: Current Every Day Smoker -- 0.50 packs/day    Types: Cigarettes  . Smokeless tobacco: Not on file  . Alcohol Use: No   OB History    No data available     Review of Systems  Constitutional: Negative for fever.  Respiratory: Negative for cough and shortness of breath.   Gastrointestinal: Positive for vomiting (2 weeks) and abdominal pain.  All other systems reviewed and are negative.     Allergies  Hydrocodone  Home Medications   Prior to Admission medications   Medication Sig Start Date End Date Taking? Authorizing Provider  cephALEXin (KEFLEX) 500 MG capsule Take 1 capsule (500 mg total) by mouth 2 (two) times daily. 09/20/13   Jennifer Piepenbrink, PA-C  guaiFENesin (ROBITUSSIN) 100 MG/5ML liquid Take 5-10 mLs (100-200 mg total) by mouth every 4 (four) hours as needed for cough. 09/20/13   Jennifer Piepenbrink, PA-C  traMADol (ULTRAM) 50 MG tablet Take 1 tablet (50 mg total) by mouth every 6 (six) hours as needed. 05/24/13   Tiffany Neva Seat, PA-C  traMADol (ULTRAM) 50 MG  tablet Take 1 tablet (50 mg total) by mouth every 6 (six) hours as needed. 09/20/13   Jennifer Piepenbrink, PA-C   BP 134/83 mmHg  Pulse 98  Temp(Src) 97.4 F (36.3 C) (Oral)  Resp 16  Ht 5' (1.524 m)  Wt 144 lb (65.318 kg)  BMI 28.12 kg/m2  SpO2 100%  LMP 05/28/2014 Physical Exam  Constitutional: She is oriented to person, place, and time. She appears well-developed and well-nourished. No distress.  HENT:  Head: Normocephalic and atraumatic.  Mouth/Throat: Oropharynx is clear and moist.  Eyes: EOM are normal. Pupils are equal, round, and reactive to light.  Neck: Normal range of motion. Neck supple.  Cardiovascular: Normal rate and regular rhythm.  Exam reveals no friction rub.   No murmur heard. Pulmonary/Chest: Effort normal and breath sounds normal. No respiratory distress. She has no wheezes. She has no rales.  Abdominal: Soft. She exhibits no distension. There is no tenderness. There is no rebound.  Genitourinary: Right adnexum displays tenderness. Right adnexum displays no mass and no fullness. Left adnexum displays no mass, no tenderness and no fullness.  Musculoskeletal: Normal range of motion. She exhibits no edema.  Neurological: She is alert and oriented to person, place, and time.  Skin: No rash noted. She is not diaphoretic.  Nursing note and vitals reviewed.   ED Course  Procedures (including critical care time) Labs Review Labs Reviewed  WET PREP, GENITAL  CBC WITH DIFFERENTIAL/PLATELET  COMPREHENSIVE METABOLIC PANEL  URINALYSIS, ROUTINE W REFLEX MICROSCOPIC (NOT AT  [facility])  I-STAT BETA HCG BLOOD, ED ([facility], [facility], [facility] ONLY)  GC/CHLAMYDIA PROBE AMP ([place]) [facility]    Imaging Review [place] Transvaginal Non-ob  07/06/2014   CLINICAL DATA:  RIGHT lower quadrant pain, RIGHT adnexal pain, negative pregnancy test  EXAM: TRANSABDOMINAL AND TRANSVAGINAL ULTRASOUND OF PELVIS  DOPPLER ULTRASOUND OF OVARIES  TECHNIQUE: Both transabdominal and transvaginal ultrasound  examinations of the pelvis were performed. Transabdominal technique was performed for global imaging of the pelvis including uterus, ovaries, adnexal regions, and pelvic cul-de-sac.  It was necessary to proceed with endovaginal exam following the transabdominal exam to visualize the LEFT ovary. Color and duplex Doppler ultrasound was utilized to evaluate blood flow to the ovaries.  COMPARISON:  None  FINDINGS: Uterus  Measurements: 6.5 x 3.1 x 4.1 cm. Retroverted. No uterine mass or focal abnormality  Endometrium  Thickness: 5 mm thick, normal. No endometrial fluid or focal abnormality  Right ovary  Measurements: 2.8 x 2.0 x 2.3 cm. Blood flow present within RIGHT ovary on color Doppler imaging. Small solid nodule 14 x 11 x 12 mm containing internal blood flow on color Doppler imaging. No additional masses.  Left ovary  Measurements: 2.3 x 1.7 x 2.2 cm. Normal morphology without mass. Internal blood flow present on color Doppler imaging.  Pulsed Doppler evaluation of both ovaries demonstrates normal low-resistance arterial and venous waveforms.  Other findings  No free fluid or additional adnexal masses.  IMPRESSION: Retroverted uterus.  Normal appearing LEFT ovary.  Small nonspecific solid RIGHT ovarian nodule 14 x 11 x 12 mm ; recommend followup gynecologic evaluation.  No evidence of ovarian torsion.   Electronically Signed   By: Ulyses SouthwardMark  Boles M.D.   On: 07/06/2014 12:58   [place] Pelvis Complete  07/06/2014   CLINICAL DATA:  RIGHT lower quadrant pain, RIGHT adnexal pain, negative pregnancy test  EXAM: TRANSABDOMINAL AND TRANSVAGINAL ULTRASOUND OF PELVIS  DOPPLER ULTRASOUND OF OVARIES  TECHNIQUE: Both transabdominal and transvaginal ultrasound examinations of the pelvis were performed. Transabdominal technique was performed for global imaging of the pelvis including uterus, ovaries, adnexal regions, and pelvic cul-de-sac.  It was necessary to proceed with endovaginal exam following the transabdominal exam to visualize  the LEFT ovary. Color and duplex Doppler ultrasound was utilized to evaluate blood flow to the ovaries.  COMPARISON:  None  FINDINGS: Uterus  Measurements: 6.5 x 3.1 x 4.1 cm. Retroverted. No uterine mass or focal abnormality  Endometrium  Thickness: 5 mm thick, normal. No endometrial fluid or focal abnormality  Right ovary  Measurements: 2.8 x 2.0 x 2.3 cm. Blood flow present within RIGHT ovary on color Doppler imaging. Small solid nodule 14 x 11 x 12 mm containing internal blood flow on color Doppler imaging. No additional masses.  Left ovary  Measurements: 2.3 x 1.7 x 2.2 cm. Normal morphology without mass. Internal blood flow present on color Doppler imaging.  Pulsed Doppler evaluation of both ovaries demonstrates normal low-resistance arterial and venous waveforms.  Other findings  No free fluid or additional adnexal masses.  IMPRESSION: Retroverted uterus.  Normal appearing LEFT ovary.  Small nonspecific solid RIGHT ovarian nodule 14 x 11 x 12 mm ; recommend followup gynecologic evaluation.  No evidence of ovarian torsion.   Electronically Signed   By: Ulyses SouthwardMark  Boles M.D.   On: 07/06/2014 12:58   [place] Art/ven Flow Abd Pelv Doppler  07/06/2014   CLINICAL DATA:  RIGHT lower quadrant pain, RIGHT adnexal pain, negative pregnancy test  EXAM: TRANSABDOMINAL AND TRANSVAGINAL ULTRASOUND  OF PELVIS  DOPPLER ULTRASOUND OF OVARIES  TECHNIQUE: Both transabdominal and transvaginal ultrasound examinations of the pelvis were performed. Transabdominal technique was performed for global imaging of the pelvis including uterus, ovaries, adnexal regions, and pelvic cul-de-sac.  It was necessary to proceed with endovaginal exam following the transabdominal exam to visualize the LEFT ovary. Color and duplex Doppler ultrasound was utilized to evaluate blood flow to the ovaries.  COMPARISON:  None  FINDINGS: Uterus  Measurements: 6.5 x 3.1 x 4.1 cm. Retroverted. No uterine mass or focal abnormality  Endometrium  Thickness: 5 mm thick,  normal. No endometrial fluid or focal abnormality  Right ovary  Measurements: 2.8 x 2.0 x 2.3 cm. Blood flow present within RIGHT ovary on color Doppler imaging. Small solid nodule 14 x 11 x 12 mm containing internal blood flow on color Doppler imaging. No additional masses.  Left ovary  Measurements: 2.3 x 1.7 x 2.2 cm. Normal morphology without mass. Internal blood flow present on color Doppler imaging.  Pulsed Doppler evaluation of both ovaries demonstrates normal low-resistance arterial and venous waveforms.  Other findings  No free fluid or additional adnexal masses.  IMPRESSION: Retroverted uterus.  Normal appearing LEFT ovary.  Small nonspecific solid RIGHT ovarian nodule 14 x 11 x 12 mm ; recommend followup gynecologic evaluation.  No evidence of ovarian torsion.   Electronically Signed   By: Ulyses Southward M.D.   On: 07/06/2014 12:58     EKG Interpretation None      MDM   Final diagnoses:  Adnexal tenderness, right    24 year old female here because she T she might be pregnant. Also evidence of intermittent crampy right lower quadrant pain every day for the past 2 weeks. Here vitals are stable. She has no right lower quadrant tenderness on abdominal exam. She does have right adnexal tenderness on ultrasound. She is not pregnant. Ultrasound showed ovarian nodule on the right but no cyst, torsion, hemorrhagic cyst. Given OB follow-up. Stable for discharge.    Elwin Mocha, MD 07/06/14 1600

## 2014-07-06 NOTE — Discharge Instructions (Signed)
Pelvic Pain Female pelvic pain can be caused by many different things and start from a variety of places. Pelvic pain refers to pain that is located in the lower half of the abdomen and between your hips. The pain may occur over a short period of time (acute) or may be reoccurring (chronic). The cause of pelvic pain may be related to disorders affecting the female reproductive organs (gynecologic), but it may also be related to the bladder, kidney stones, an intestinal complication, or muscle or skeletal problems. Getting help right away for pelvic pain is important, especially if there has been severe, sharp, or a sudden onset of unusual pain. It is also important to get help right away because some types of pelvic pain can be life threatening.  CAUSES  Below are only some of the causes of pelvic pain. The causes of pelvic pain can be in one of several categories.   Gynecologic.  Pelvic inflammatory disease.  Sexually transmitted infection.  Ovarian cyst or a twisted ovarian ligament (ovarian torsion).  Uterine lining that grows outside the uterus (endometriosis).  Fibroids, cysts, or tumors.  Ovulation.  Pregnancy.  Pregnancy that occurs outside the uterus (ectopic pregnancy).  Miscarriage.  Labor.  Abruption of the placenta or ruptured uterus.  Infection.  Uterine infection (endometritis).  Bladder infection.  Diverticulitis.  Miscarriage related to a uterine infection (septic abortion).  Bladder.  Inflammation of the bladder (cystitis).  Kidney stone(s).  Gastrointestinal.  Constipation.  Diverticulitis.  Neurologic.  Trauma.  Feeling pelvic pain because of mental or emotional causes (psychosomatic).  Cancers of the bowel or pelvis. EVALUATION  Your caregiver will want to take a careful history of your concerns. This includes recent changes in your health, a careful gynecologic history of your periods (menses), and a sexual history. Obtaining your family  history and medical history is also important. Your caregiver may suggest a pelvic exam. A pelvic exam will help identify the location and severity of the pain. It also helps in the evaluation of which organ system may be involved. In order to identify the cause of the pelvic pain and be properly treated, your caregiver may order tests. These tests may include:   A pregnancy test.  Pelvic ultrasonography.  An X-ray exam of the abdomen.  A urinalysis or evaluation of vaginal discharge.  Blood tests. HOME CARE INSTRUCTIONS   Only take over-the-counter or prescription medicines for pain, discomfort, or fever as directed by your caregiver.   Rest as directed by your caregiver.   Eat a balanced diet.   Drink enough fluids to make your urine clear or pale yellow, or as directed.   Avoid sexual intercourse if it causes pain.   Apply warm or cold compresses to the lower abdomen depending on which one helps the pain.   Avoid stressful situations.   Keep a journal of your pelvic pain. Write down when it started, where the pain is located, and if there are things that seem to be associated with the pain, such as food or your menstrual cycle.  Follow up with your caregiver as directed.  SEEK MEDICAL CARE IF:  Your medicine does not help your pain.  You have abnormal vaginal discharge. SEEK IMMEDIATE MEDICAL CARE IF:   You have heavy bleeding from the vagina.   Your pelvic pain increases.   You feel light-headed or faint.   You have chills.   You have pain with urination or blood in your urine.   You have uncontrolled diarrhea   or vomiting.   You have a fever or persistent symptoms for more than 3 days.  You have a fever and your symptoms suddenly get worse.   You are being physically or sexually abused.  MAKE SURE YOU:  Understand these instructions.  Will watch your condition.  Will get help if you are not doing well or get worse. Document Released:  11/18/2003 Document Revised: 05/07/2013 Document Reviewed: 04/12/2011 Exit[employer] Patient Information 2015 [employer]. This information is not intended to replace advice given to you by your health care provider. Make sure you discuss any questions you have with your health care provider.  

## 2014-07-06 NOTE — ED Notes (Signed)
Pt sts " I think I might be pregnant"

## 2014-07-06 NOTE — ED Notes (Signed)
Pt states irregular period x 3 months and then missing her most recent period (last menses was 5/24).  Pt now c/o R side pain and urinary frequency, though denies vaginal discharge.  Pt took a home pregnancy test that was negative, but states with her last pregnancy, she had false negatives.

## 2014-07-09 LAB — GC/CHLAMYDIA PROBE AMP (~~LOC~~) NOT AT ARMC
CHLAMYDIA, DNA PROBE: NEGATIVE
NEISSERIA GONORRHEA: NEGATIVE

## 2014-08-21 ENCOUNTER — Ambulatory Visit (INDEPENDENT_AMBULATORY_CARE_PROVIDER_SITE_OTHER): Payer: Medicaid Other | Admitting: Obstetrics & Gynecology

## 2014-08-21 ENCOUNTER — Encounter: Payer: Self-pay | Admitting: Obstetrics & Gynecology

## 2014-08-21 VITALS — BP 107/68 | HR 82 | Wt 135.0 lb

## 2014-08-21 DIAGNOSIS — Z308 Encounter for other contraceptive management: Secondary | ICD-10-CM

## 2014-08-21 DIAGNOSIS — Z3049 Encounter for surveillance of other contraceptives: Secondary | ICD-10-CM

## 2014-08-21 NOTE — Progress Notes (Signed)
   Subjective:    Patient ID: Kathleen Chapman, female    DOB: May 08, 1990, 24 y.o.   MRN: [id number]  HPI 24 yo S AA G1P1 (5 yo son Derwood Kaplan) is here today as a [facility] [place] for removal of her Nexplanon ( in for about 4 years). She wants a pregnancy. She is aware that she should start MVIs daily to prevent birth defects. RLQ pain started in June. She has tried IBU with relief. U/S showed a 1 cm nodule on the right ovary.   Review of Systems     Objective:   Physical Exam Consent was signed and time out was done. Her right arm was prepped with betadine after establishing the position of the Nexplanon. The area was infiltrated with 2 cc of 1% lidocaine. A small incision was made and the intact rod was easily removed. A steristrip was placed and her arm was noted to be hemostatic. It was bandaged.  She tolerated the procedure well.       Assessment & Plan:  RLQ pain- check cervical cultures. I sent an email to Dr. Andrey Farmer regarding her u/s findings. Desire for pregnancy- Nexplanon removed. Rec MVI daily and Zika prevention.

## 2014-08-22 ENCOUNTER — Encounter: Payer: Self-pay | Admitting: *Deleted

## 2014-09-05 ENCOUNTER — Encounter: Payer: Self-pay | Admitting: *Deleted

## 2014-09-05 ENCOUNTER — Telehealth: Payer: Self-pay | Admitting: *Deleted

## 2014-09-05 NOTE — Telephone Encounter (Signed)
-----   Message from Arne Cleveland, [place] sent at 09/02/2014  8:17 AM EDT ----- Ellwood Handler,  This is a [facility] patient.  I think this is just an FYI so I am forwarding it on to you.   I hope you had a great vacation and got to relax some!  Mandy   ----- Message -----    From: Allie Bossier, MD    Sent: 08/29/2014  11:34 AM      To: Arne Cleveland, CMA  She will need a repeat u/s in 4 months. That was the recommendation from the gyn onc. Thanks

## 2014-09-05 NOTE — Telephone Encounter (Signed)
Attempted to call the pt for a second time in regards to follow-up ultrasound appt.  Unable to reach via phone message. Sent letter to residence for pt to call office to schedule [place] appt in 4 months to follow-up an ovarian nodule seen on previous US.

## 2014-11-04 ENCOUNTER — Encounter (HOSPITAL_COMMUNITY): Payer: Self-pay | Admitting: *Deleted

## 2014-11-04 ENCOUNTER — Emergency Department (HOSPITAL_COMMUNITY)
Admission: EM | Admit: 2014-11-04 | Discharge: 2014-11-04 | Disposition: A | Discharge status: Home / Self Care | Payer: Medicaid Other | Attending: Emergency Medicine | Admitting: Emergency Medicine

## 2014-11-04 DIAGNOSIS — Z3201 Encounter for pregnancy test, result positive: Secondary | ICD-10-CM | POA: Insufficient documentation

## 2014-11-04 DIAGNOSIS — O210 Mild hyperemesis gravidarum: Secondary | ICD-10-CM | POA: Insufficient documentation

## 2014-11-04 DIAGNOSIS — Z87891 Personal history of nicotine dependence: Secondary | ICD-10-CM | POA: Diagnosis not present

## 2014-11-04 DIAGNOSIS — Z3490 Encounter for supervision of normal pregnancy, unspecified, unspecified trimester: Secondary | ICD-10-CM

## 2014-11-04 DIAGNOSIS — Z3200 Encounter for pregnancy test, result unknown: Secondary | ICD-10-CM | POA: Diagnosis present

## 2014-11-04 LAB — POC URINE PREG, ED: Preg Test, Ur: POSITIVE — AB

## 2014-11-04 NOTE — Discharge Instructions (Signed)
1. Medications: usual home medications 2. Treatment: rest, drink plenty of fluids,  3. Follow Up: Please followup with your OB/GYN in 7 days for discussion of your diagnoses and further evaluation after today's visit; if you do not have a primary care doctor use the resource guide provided to find one; Please return to the ER for vaginal bleeding, abd pain or other concerns    First Trimester of Pregnancy The first trimester of pregnancy is from week 1 until the end of week 12 (months 1 through 3). A week after a sperm fertilizes an egg, the egg will implant on the wall of the uterus. This embryo will begin to develop into a baby. Genes from you and your partner are forming the baby. The female genes determine whether the baby is a boy or a girl. At 6-8 weeks, the eyes and face are formed, and the heartbeat can be seen on ultrasound. At the end of 12 weeks, all the baby's organs are formed.  Now that you are pregnant, you will want to do everything you can to have a healthy baby. Two of the most important things are to get good prenatal care and to follow your health care provider's instructions. Prenatal care is all the medical care you receive before the baby's birth. This care will help prevent, find, and treat any problems during the pregnancy and childbirth. BODY CHANGES Your body goes through many changes during pregnancy. The changes vary from woman to woman.   You may gain or lose a couple of pounds at first.  You may feel sick to your stomach (nauseous) and throw up (vomit). If the vomiting is uncontrollable, call your health care provider.  You may tire easily.  You may develop headaches that can be relieved by medicines approved by your health care provider.  You may urinate more often. Painful urination may mean you have a bladder infection.  You may develop heartburn as a result of your pregnancy.  You may develop constipation because certain hormones are causing the muscles that  push waste through your intestines to slow down.  You may develop hemorrhoids or swollen, bulging veins (varicose veins).  Your breasts may begin to grow larger and become tender. Your nipples may stick out more, and the tissue that surrounds them (areola) may become darker.  Your gums may bleed and may be sensitive to brushing and flossing.  Dark spots or blotches (chloasma, mask of pregnancy) may develop on your face. This will likely fade after the baby is born.  Your menstrual periods will stop.  You may have a loss of appetite.  You may develop cravings for certain kinds of food.  You may have changes in your emotions from day to day, such as being excited to be pregnant or being concerned that something may go wrong with the pregnancy and baby.  You may have more vivid and strange dreams.  You may have changes in your hair. These can include thickening of your hair, rapid growth, and changes in texture. Some women also have hair loss during or after pregnancy, or hair that feels dry or thin. Your hair will most likely return to normal after your baby is born. WHAT TO EXPECT AT YOUR PRENATAL VISITS During a routine prenatal visit:  You will be weighed to make sure you and the baby are growing normally.  Your blood pressure will be taken.  Your abdomen will be measured to track your baby's growth.  The fetal heartbeat will  be listened to starting around week 10 or 12 of your pregnancy.  Test results from any previous visits will be discussed. Your health care provider may ask you:  How you are feeling.  If you are feeling the baby move.  If you have had any abnormal symptoms, such as leaking fluid, bleeding, severe headaches, or abdominal cramping.  If you are using any tobacco products, including cigarettes, chewing tobacco, and electronic cigarettes.  If you have any questions. Other tests that may be performed during your first trimester include:  Blood tests to  find your blood type and to check for the presence of any previous infections. They will also be used to check for low iron levels (anemia) and Rh antibodies. Later in the pregnancy, blood tests for diabetes will be done along with other tests if problems develop.  Urine tests to check for infections, diabetes, or protein in the urine.  An ultrasound to confirm the proper growth and development of the baby.  An amniocentesis to check for possible genetic problems.  Fetal screens for spina bifida and Down syndrome.  You may need other tests to make sure you and the baby are doing well.  HIV (human immunodeficiency virus) testing. Routine prenatal testing includes screening for HIV, unless you choose not to have this test. HOME CARE INSTRUCTIONS  Medicines  Follow your health care provider's instructions regarding medicine use. Specific medicines may be either safe or unsafe to take during pregnancy.  Take your prenatal vitamins as directed.  If you develop constipation, try taking a stool softener if your health care provider approves. Diet  Eat regular, well-balanced meals. Choose a variety of foods, such as meat or vegetable-based protein, fish, milk and low-fat dairy products, vegetables, fruits, and whole grain breads and cereals. Your health care provider will help you determine the amount of weight gain that is right for you.  Avoid raw meat and uncooked cheese. These carry germs that can cause birth defects in the baby.  Eating four or five small meals rather than three large meals a day may help relieve nausea and vomiting. If you start to feel nauseous, eating a few soda crackers can be helpful. Drinking liquids between meals instead of during meals also seems to help nausea and vomiting.  If you develop constipation, eat more high-fiber foods, such as fresh vegetables or fruit and whole grains. Drink enough fluids to keep your urine clear or pale yellow. Activity and  Exercise  Exercise only as directed by your health care provider. Exercising will help you:  Control your weight.  Stay in shape.  Be prepared for labor and delivery.  Experiencing pain or cramping in the lower abdomen or low back is a good sign that you should stop exercising. Check with your health care provider before continuing normal exercises.  Try to avoid standing for long periods of time. Move your legs often if you must stand in one place for a long time.  Avoid heavy lifting.  Wear low-heeled shoes, and practice good posture.  You may continue to have sex unless your health care provider directs you otherwise. Relief of Pain or Discomfort  Wear a good support bra for breast tenderness.   Take warm sitz baths to soothe any pain or discomfort caused by hemorrhoids. Use hemorrhoid cream if your health care provider approves.   Rest with your legs elevated if you have leg cramps or low back pain.  If you develop varicose veins in your legs, wear  support hose. Elevate your feet for 15 minutes, 3-4 times a day. Limit salt in your diet. Prenatal Care  Schedule your prenatal visits by the twelfth week of pregnancy. They are usually scheduled monthly at first, then more often in the last 2 months before delivery.  Write down your questions. Take them to your prenatal visits.  Keep all your prenatal visits as directed by your health care provider. Safety  Wear your seat belt at all times when driving.  Make a list of emergency phone numbers, including numbers for family, friends, the hospital, and police and fire departments. General Tips  Ask your health care provider for a referral to a local prenatal education class. Begin classes no later than at the beginning of month 6 of your pregnancy.  Ask for help if you have counseling or nutritional needs during pregnancy. Your health care provider can offer advice or refer you to specialists for help with various  needs.  Do not use hot tubs, steam rooms, or saunas.  Do not douche or use tampons or scented sanitary pads.  Do not cross your legs for long periods of time.  Avoid cat litter boxes and soil used by cats. These carry germs that can cause birth defects in the baby and possibly loss of the fetus by miscarriage or stillbirth.  Avoid all smoking, herbs, alcohol, and medicines not prescribed by your health care provider. Chemicals in these affect the formation and growth of the baby.  Do not use any tobacco products, including cigarettes, chewing tobacco, and electronic cigarettes. If you need help quitting, ask your health care provider. You may receive counseling support and other resources to help you quit.  Schedule a dentist appointment. At home, brush your teeth with a soft toothbrush and be gentle when you floss. SEEK MEDICAL CARE IF:   You have dizziness.  You have mild pelvic cramps, pelvic pressure, or nagging pain in the abdominal area.  You have persistent nausea, vomiting, or diarrhea.  You have a bad smelling vaginal discharge.  You have pain with urination.  You notice increased swelling in your face, hands, legs, or ankles. SEEK IMMEDIATE MEDICAL CARE IF:   You have a fever.  You are leaking fluid from your vagina.  You have spotting or bleeding from your vagina.  You have severe abdominal cramping or pain.  You have rapid weight gain or loss.  You vomit blood or material that looks like coffee grounds.  You are exposed to [place] measles and have never had them.  You are exposed to fifth disease or chickenpox.  You develop a severe headache.  You have shortness of breath.  You have any kind of trauma, such as from a fall or a car accident.   This information is not intended to replace advice given to you by your health care provider. Make sure you discuss any questions you have with your health care provider.   Document Released: 12/15/2000 Document  Revised: 01/11/2014 Document Reviewed: 10/31/2012 Elsevier Interactive Patient Education 2016 [employer].    Emergency Department Resource Guide 1) Find a Doctor and Pay Out of Pocket Although you won't have to find out who is covered by your insurance plan, it is a good idea to ask around and get recommendations. You will then need to call the office and see if the doctor you have chosen will accept you as a new patient and what types of options they offer for patients who are self-pay. Some doctors offer  discounts or will set up payment plans for their patients who do not have insurance, but you will need to ask so you aren't surprised when you get to your appointment.  2) Contact Your Local Health Department Not all health departments have doctors that can see patients for sick visits, but many do, so it is worth a call to see if yours does. If you don't know where your local health department is, you can check in your phone book. The CDC also has a tool to help you locate your state's health department, and many state websites also have listings of all of their local health departments.  3) Find a Walk-in Clinic If your illness is not likely to be very severe or complicated, you may want to try a walk in clinic. These are popping up all over the country in pharmacies, drugstores, and shopping centers. They're usually staffed by nurse practitioners or physician assistants that have been trained to treat common illnesses and complaints. They're usually fairly quick and inexpensive. However, if you have serious medical issues or chronic medical problems, these are probably not your best option.  No Primary Care Doctor: - Call Health Connect at  [phone] - they can help you locate a primary care doctor that  accepts your insurance, provides certain services, etc. - Physician Referral Service- [phone]  Chronic Pain Problems: Organization         Address  Phone   Notes  [facility]  [phone] Patients need to be referred by their primary care doctor.   Medication Assistance: Organization         Address  Phone   Notes  [facility] Medication [facility] [address] [place] [place], [place] [address] [phone] --Must be a resident of [facility] -- Must have NO insurance coverage whatsoever (no Medicaid/ Medicare, etc.) -- The pt. MUST have a primary care doctor that directs their care regularly and follows them in the community   MedAssist  [phone]   [employer]  [phone]    Agencies that provide inexpensive medical care: Organization         Address  Phone   Notes  [facility]  [phone]   [facility]    [phone]   [facility] [address] [place], [place] [address] [phone]   [facility] [address] [place], [place] [phone]   Planned Parenthood    [phone]   [facility]    [phone]   [facility] and [facility]  [address], [place] Phone:  [phone], Fax:  [phone] Hours of Operation:  9 am - 6 pm, M-F.  Also accepts Medicaid/Medicare and self-pay.  [facility]  [address], [place] Phone: [phone], Fax: [phone]. Hours of Operation:  8:30 am - 5:30 pm, M-F.  Also accepts Medicaid and self-pay.  [facility] [place] [address], [place] Phone: [phone]   [facility] [address] Natasha Bence [place], [place] [phone], Ext. [phone] Mondays & Thursdays: 7-9 AM.  First 15 patients are seen on a first come, first serve basis.    Medicaid-accepting [facility] Providers:  Organization         Address  Phone   Notes  [facility] [address], Ervin Knack, [place] (  [phone] Also accepts self-pay patients.  [facility] [address]  Laurell Josephs [place], [place]  [phone]   [facility] [address], [place] [phone]   [facility] [address], [place] [phone]   Renaye Rakers [address], [place]   [phone] Only accepts [place] Access [place] patients after they have their name applied to their card.   Self-Pay (no insurance) in [facility]:  Organization         Address  Phone   Notes  Sickle Cell Patients, [facility] [address] [place], [place] [phone]   [facility] [address] [place], [place] [phone]   [facility]  [address], [place] [phone]   [facility]/Dr. Osei-Bonsu  [address] [place] or [address], [place] [phone] Phone number for both [place] and [place] locations is the same.  [facility] [address], [place] [phone]   [facility] [address] [place] or [address] [phone]   [facility] [address] [place] [phone], phone; [phone], fax Sees patients 1st and 3rd Saturday of every month.  Must not qualify for public or private insurance (i.e. Medicaid, Medicare, [place] Health Choice, Veterans' Benefits)  Household income should be no more than 200% of the poverty level The clinic cannot treat you if you are pregnant or think you are pregnant  Sexually transmitted diseases are not treated at the clinic.    Dental Care: Organization         Address  Phone  Notes  [facility] Department of [facility] [address] [place], [place] [phone] Accepts children up to age 4 who are enrolled in [place] or [place] Health Choice; pregnant women with a Medicaid card; and children who have applied for Medicaid  or [place] Health Choice, but were declined, whose parents can pay a reduced fee at time of service.  [facility] Department of [facility]  [address], [place] [phone] Accepts children up to age 44 who are enrolled in [place] or [place] Health Choice; pregnant women with a Medicaid card; and children who have applied for Medicaid or [place] Health Choice, but were declined, whose parents can pay a reduced fee at time of service.  [facility]  [address] [place], [place] [phone] Patients are seen by appointment only. Walk-ins are not accepted. [facility] will see patients 39 years of age and older. Monday - Tuesday (8am-5pm) Most Wednesdays (8:30-5pm) $30 per visit, cash only  [facility] Adult Dental Access PRO[facility]  [address], [facility] [phone] Patients are seen by appointment only. Walk-ins are not accepted. [facility] will see patients 21 years of age and older. One Wednesday Evening (Monthly: Volunteer Based).  $30 per visit, cash only  [employer]  [phone] for adults; Children under age 42, call Graduate Pediatric Dentistry at [phone]. Children aged 95-14, please call [phone] to request a pediatric application.  Dental services are provided in all areas of dental care including fillings, crowns and Mrozek, complete and partial dentures, implants, gum treatment, root canals, and extractions. Preventive care is also  provided. Treatment is provided to both adults and children. Patients are selected via a lottery and there is often a waiting list.   [facility] [address] [place]  [phone] [email or web address]   [facility] [address] [place], [place] [phone], Ext. [phone] Second and Fourth Thursday of each month, opens at 6:30 AM; Clinic ends at 9 AM.  Patients are seen on a first-come first-served basis, and a limited number are seen  during each clinic.   [facility]  [address] Ether Griffins [place], [place] [phone]   Eligibility Requirements You must have lived in [place], [place], or [place] counties for at least the last three months.   You cannot be eligible for state or federal sponsored [place], including [employer], [place], or [employer].   You generally cannot be eligible for healthcare insurance through your employer.    How to apply: Eligibility screenings are held every Tuesday and Wednesday afternoon from 1:00 pm until 4:00 pm. You do not need an appointment for the interview!  [facility] [address], [place], [place] [phone]   [facility] Health Department  [phone]   [facility] Health Department  [phone]   [facility] Health Department  [phone]    Behavioral Health Resources in the Community: Intensive Outpatient Programs Organization         Address  Phone  Notes  [facility] [address], [place], [place] [phone]   [facility] [address] [place], [place] [phone]   [facility] [address] [place], [place]  [phone]   [facility] Mental Health [address]  [place], [place] [phone] or [phone]   Substance Abuse Resources Organization         Address  Phone  Notes  [facility]  [phone]   [facility]  [phone]   [facility] [place]  [phone]   Floydene Flock  [phone]   Residential & Outpatient Substance Abuse Program  [phone]   Psychological Services Organization         Address  Phone  Notes  [facility] Behavioral Health  [phone]   [facility]  [phone]   [facility] [address], [place] [phone] or [phone]    Mobile Crisis Teams Organization         Address  Phone  Notes  [facility],  Mobile Crisis Care Unit  [phone]   [facility]  [address] [place], [place] [phone]   Doristine Locks [address] [place] [phone]    Self-Help/Support Groups Organization         Address  Phone             Notes  [facility] [place] - variety of support groups  [phone]- [phone] Call for more information  Narcotics Anonymous (NA), [facility] [address], [employer] [place]  2 meetings at this location   Statistician         Address  Phone  Notes  [facility] [address],    [place]  [phone]   [facility]  [address], [place] [address], [place], [place] [phone]   [facility] [address] [place], [place] [phone] Admissions: 8am-3pm M-F  [facility] [address]  [place], [place] [phone]   [facility] [address] Starling Manns [place], [place] [phone]   [facility] [address]  [place], [place] [phone]   [facility] - Intensive Outpatient [address] Laurell Josephs [address], [place], [place] [phone]   [facility] ([facility] [address] [place]  [place], [place] [phone] or [phone]   [facility] ([facility]) [address], [place], [place] [phone] Accepts Medicaid  [facility] [address]  [place] [phone] Substance Abuse/Addiction Treatment   [facility] Organization         Address  Phone  Notes  [facility]  [phone]   Angie Fava, PhD [address] Ervin Knack [place], [place]   [phone] or [phone]   [facility] Behavioral   [address] [place], [place] [phone]   [facility] [address] [place], [place] [phone] Insurance/Medicaid/sponsorship through [facility] and Families [address]                                    [place], [place] [phone] Therapy/tele-psych/case  [facility] [address] [place], [place] [phone]    Dr. Lolly Mustache  [phone]   [facility]  [employer] [facility] Dept. 1) [address] [place] 2) [address], [place] 3)  [address], [place] [phone]  [phone]   [facility] Child Abuse Hotline [phone] or [phone] (After Hours)

## 2014-11-04 NOTE — ED Provider Notes (Signed)
CSN: [id number]     Arrival date & time 11/04/14  1318 History  By signing my name below, I, Kathleen Chapman, attest that this documentation has been prepared under the direction and in the presence of non-physician practitioner, Dierdre ForthHannah Dailynn Nancarrow, PA-C. Electronically Signed: Freida Busmaniana Chapman, Scribe. 11/04/2014. 3:02 PM.    Chief Complaint  Patient presents with  . Possible Pregnancy    The history is provided by the patient. No language interpreter was used.    HPI Comments:  Kathleen Chapman is a 24 y.o. female G2P1A0, who presents to the Emergency Department for confirmation of pregnancy. Pt states she had a positive pregnancy test at home 2 days agp and would like verification in the ED in order to follow up with her OB/GYN. Pt reports nausea and vomiting intermittently at home. She notes a very small amount of bright red blood on her partner's penis after sexual intercourse prior to taking the test a few days ago, but pt notes there was no blood when she wiped or since. She denies h/o ectopic pregnancy. She also denies abdominal pain, vaginal discharge and dysuria. No modifying factors noted. LNMP was Sept 20, 2016.  RN note reports abd cramping, but pt reports this is a fullness in her abd and she is without abd pain, dysuria, vaginal bleeding or vaginal discharge.  Pt reports she is here in the ED because when she called Dr. Elsie StainMarshall's office to set up an appointment she was told she needed a verified pregnancy test and referral from the ED.  Pt reports no concerns about this pregnancy. LMP 09-24-14   OBGYN Francoise CeoMarshall Bernard History reviewed. No pertinent past medical history. History reviewed. No pertinent past surgical history. Family History  Problem Relation Age of Onset  . Diabetes Maternal Grandmother   . Diabetes Maternal Grandfather   . Diabetes Paternal Grandmother   . Diabetes Paternal Grandfather    Social History  Substance Use Topics  . Smoking status: Former Smoker --  0.50 packs/day    Types: Cigarettes    Quit date: 10/28/2014  . Smokeless tobacco: None  . Alcohol Use: No   OB History    No data available     Review of Systems  Constitutional: Negative for fever, chills, diaphoresis, appetite change, fatigue and unexpected weight change.  HENT: Negative for mouth sores.   Eyes: Negative for visual disturbance.  Respiratory: Negative for cough, chest tightness, shortness of breath and wheezing.   Cardiovascular: Negative for chest pain.  Gastrointestinal: Positive for nausea and vomiting. Negative for abdominal pain, diarrhea and constipation.  Endocrine: Negative for polydipsia, polyphagia and polyuria.  Genitourinary: Negative for dysuria, urgency, frequency, hematuria, vaginal bleeding and vaginal discharge.  Musculoskeletal: Negative for back pain and neck stiffness.  Skin: Negative for rash.  Allergic/Immunologic: Negative for immunocompromised state.  Neurological: Negative for syncope, light-headedness and headaches.  Hematological: Does not bruise/bleed easily.  Psychiatric/Behavioral: Negative for sleep disturbance. The patient is not nervous/anxious.     Allergies  Hydrocodone  Home Medications   Prior to Admission medications   Not on File   BP 129/60 mmHg  Pulse 70  Temp(Src) 98.3 F (36.8 C) (Oral)  Resp 18  Ht 5' (1.524 m)  Wt 134 lb (60.782 kg)  BMI 26.17 kg/m2  SpO2 100%  LMP 09/30/2014 Physical Exam  Constitutional: She appears well-developed and well-nourished. No distress.  Awake, alert, nontoxic appearance  HENT:  Head: Normocephalic and atraumatic.  Mouth/Throat: Oropharynx is clear and moist. No oropharyngeal exudate.  Eyes: Conjunctivae are normal. No scleral icterus.  Neck: Normal range of motion. Neck supple.  Cardiovascular: Normal rate, regular rhythm, normal heart sounds and intact distal pulses.   No murmur heard. Pulmonary/Chest: Effort normal and breath sounds normal. No respiratory distress.  She has no wheezes.  Equal chest expansion  Abdominal: Soft. Bowel sounds are normal. She exhibits no mass. There is no tenderness. There is no rebound and no guarding.  abd soft and nontender No palpable gravid uterus  Musculoskeletal: Normal range of motion. She exhibits no edema.  Neurological: She is alert.  Speech is clear and goal oriented Moves extremities without ataxia  Skin: Skin is warm and dry. She is not diaphoretic.  Psychiatric: She has a normal mood and affect.  Nursing note and vitals reviewed.   ED Course  Procedures   DIAGNOSTIC STUDIES:  Oxygen Saturation is 100% on RA, normal by my interpretation.    COORDINATION OF CARE:  2:11 PM Discussed treatment plan with pt at bedside and pt agreed to plan.  Labs Review Labs Reviewed  POC URINE PREG, ED - Abnormal; Notable for the following:    Preg Test, Ur POSITIVE (*)    All other components within normal limits    Imaging Review No results found. I have personally reviewed and evaluated these  lab results as part of my medical decision-making.   EKG Interpretation None      MDM   Final diagnoses:  Pregnancy   Corah Desena presents with need for pregnancy test and LMP 09-24-14.  Pt also needs referral to OB/GYN.  Pt denies abd pain.  She reports one episode of a small amount of blood noted on her partner immediately after intercourse, but no specific or recurrent vaginal bleeding.  She denies any current vaginal bleeding.  Pt without a hx of ectopic pregnancy.  Doubt ectopic pregnancy but counseled pt of the possibility.  She requests simply the pregnancy test and referral today reporting she will set up and appointment with Dr. [name].      Pregnancy test positive.  Referral made.  Discussed reasons to return to the ED including abd pain, and any further amount of vaginal bleeding or other concerns.  Pt understands potential risk and is comfortable with plan for discharge and out patient  follow-up.  BP 129/60 mmHg  Pulse 70  Temp(Src) 98.3 F (36.8 C) (Oral)  Resp 18  Ht 5' (1.524 m)  Wt 134 lb (60.782 kg)  BMI 26.17 kg/m2  SpO2 100%  LMP 09/30/2014     Dahlia Client Marshell Dilauro, PA-C 11/04/14 1503  Eber Hong, MD 11/05/14 [phone]

## 2014-11-04 NOTE — ED Notes (Addendum)
PT reports LMP was 09-24-14. Pt reports she and partner have been trying and have not used protection.Pt reports cramping in anterior lower ABD. Pt also reports Her partner saw a little bit of blood on his penis after last time . Some time last week.

## 2014-11-04 NOTE — ED Notes (Signed)
Declined W/C at D/C and was escorted to lobby by RN. 

## 2014-11-04 NOTE — ED Notes (Signed)
PT reports a postive home preg. Test 2 days ago.

## 2014-12-10 ENCOUNTER — Emergency Department (HOSPITAL_COMMUNITY)
Admission: EM | Admit: 2014-12-10 | Discharge: 2014-12-10 | Disposition: A | Discharge status: Home / Self Care | Payer: Medicaid Other | Attending: Emergency Medicine | Admitting: Emergency Medicine

## 2014-12-10 ENCOUNTER — Encounter (HOSPITAL_COMMUNITY): Payer: Self-pay | Admitting: Emergency Medicine

## 2014-12-10 DIAGNOSIS — Z3A10 10 weeks gestation of pregnancy: Secondary | ICD-10-CM | POA: Diagnosis not present

## 2014-12-10 DIAGNOSIS — Z87891 Personal history of nicotine dependence: Secondary | ICD-10-CM | POA: Diagnosis not present

## 2014-12-10 DIAGNOSIS — O210 Mild hyperemesis gravidarum: Secondary | ICD-10-CM | POA: Diagnosis not present

## 2014-12-10 DIAGNOSIS — R55 Syncope and collapse: Secondary | ICD-10-CM | POA: Diagnosis not present

## 2014-12-10 DIAGNOSIS — O9989 Other specified diseases and conditions complicating pregnancy, childbirth and the puerperium: Secondary | ICD-10-CM | POA: Insufficient documentation

## 2014-12-10 DIAGNOSIS — Z3490 Encounter for supervision of normal pregnancy, unspecified, unspecified trimester: Secondary | ICD-10-CM

## 2014-12-10 LAB — CBC
HCT: 41.5 % (ref 36.0–46.0)
Hemoglobin: 14.3 g/dL (ref 12.0–15.0)
MCH: 31.6 pg (ref 26.0–34.0)
MCHC: 34.5 g/dL (ref 30.0–36.0)
MCV: 91.8 fL (ref 78.0–100.0)
Platelets: 276 10*3/uL (ref 150–400)
RBC: 4.52 MIL/uL (ref 3.87–5.11)
RDW: 13.5 % (ref 11.5–15.5)
WBC: 16.6 10*3/uL — ABNORMAL HIGH (ref 4.0–10.5)

## 2014-12-10 LAB — BASIC METABOLIC PANEL
Anion gap: 9 (ref 5–15)
BUN: 8 mg/dL (ref 6–20)
CO2: 23 mmol/L (ref 22–32)
Calcium: 9.5 mg/dL (ref 8.9–10.3)
Chloride: 103 mmol/L (ref 101–111)
Creatinine, Ser: 0.72 mg/dL (ref 0.44–1.00)
GFR calc Af Amer: >60 mL/min (ref >60)
GLUCOSE: 93 mg/dL (ref 65–99)
Potassium: 4.5 mmol/L (ref 3.5–5.1)
Sodium: 135 mmol/L (ref 135–145)

## 2014-12-10 LAB — URINALYSIS, ROUTINE W REFLEX MICROSCOPIC
BILIRUBIN URINE: NEGATIVE
GLUCOSE, UA: NEGATIVE mg/dL
Hgb urine dipstick: NEGATIVE
KETONES UR: NEGATIVE mg/dL
NITRITE: NEGATIVE
Protein, ur: NEGATIVE mg/dL
Specific Gravity, Urine: 1.006 (ref 1.005–1.030)
pH: 6 (ref 5.0–8.0)

## 2014-12-10 LAB — URINE MICROSCOPIC-ADD ON: RBC / HPF: NONE SEEN RBC/hpf (ref 0–5)

## 2014-12-10 LAB — I-STAT BETA HCG BLOOD, ED (MC, WL, AP ONLY): I-stat hCG, quantitative: >2000 m[IU]/mL — ABNORMAL HIGH (ref <5)

## 2014-12-10 LAB — CBG MONITORING, ED: Glucose-Capillary: 79 mg/dL (ref 65–99)

## 2014-12-10 MED ORDER — ONDANSETRON 4 MG PO TBDP: 4.0000 mg | ORAL_TABLET | Freq: Three times a day (TID) | ORAL | Status: DC | PRN

## 2014-12-10 NOTE — ED Notes (Signed)
Pt wheeled to and from bathroom.

## 2014-12-10 NOTE — ED Notes (Signed)
MD at bedside. 

## 2014-12-10 NOTE — Discharge Instructions (Signed)
Rest, push fluids, stay hydrated. Follow up with you OB as soon as possible.   First Trimester of Pregnancy The first trimester of pregnancy is from week 1 until the end of week 12 (months 1 through 3). During this time, your baby will begin to develop inside you. At 6-8 weeks, the eyes and face are formed, and the heartbeat can be seen on ultrasound. At the end of 12 weeks, all the baby's organs are formed. Prenatal care is all the medical care you receive before the birth of your baby. Make sure you get good prenatal care and follow all of your doctor's instructions. HOME CARE  Medicines  Take medicine only as told by your doctor. Some medicines are safe and some are not during pregnancy.  Take your prenatal vitamins as told by your doctor.  Take medicine that helps you poop (stool softener) as needed if your doctor says it is okay. Diet  Eat regular, healthy meals.  Your doctor will tell you the amount of weight gain that is right for you.  Avoid raw meat and uncooked cheese.  If you feel sick to your stomach (nauseous) or throw up (vomit):  Eat 4 or 5 small meals a day instead of 3 large meals.  Try eating a few soda crackers.  Drink liquids between meals instead of during meals.  If you have a hard time pooping (constipation):  Eat high-fiber foods like fresh vegetables, fruit, and whole grains.  Drink enough fluids to keep your pee (urine) clear or pale yellow. Activity and Exercise  Exercise only as told by your doctor. Stop exercising if you have cramps or pain in your lower belly (abdomen) or low back.  Try to avoid standing for long periods of time. Move your legs often if you must stand in one place for a long time.  Avoid heavy lifting.  Wear low-heeled shoes. Sit and stand up straight.  You can have sex unless your doctor tells you not to. Relief of Pain or Discomfort  Wear a good support bra if your breasts are sore.  Take warm water baths (sitz baths)  to soothe pain or discomfort caused by hemorrhoids. Use hemorrhoid cream if your doctor says it is okay.  Rest with your legs raised if you have leg cramps or low back pain.  Wear support hose if you have puffy, bulging veins (varicose veins) in your legs. Raise (elevate) your feet for 15 minutes, 3-4 times a day. Limit salt in your diet. Prenatal Care  Schedule your prenatal visits by the twelfth week of pregnancy.  Write down your questions. Take them to your prenatal visits.  Keep all your prenatal visits as told by your doctor. Safety  Wear your seat belt at all times when driving.  Make a list of emergency phone numbers. The list should include numbers for family, friends, the hospital, and police and fire departments. General Tips  Ask your doctor for a referral to a local prenatal class. Begin classes no later than at the start of month 6 of your pregnancy.  Ask for help if you need counseling or help with nutrition. Your doctor can give you advice or tell you where to go for help.  Do not use hot tubs, steam rooms, or saunas.  Do not douche or use tampons or scented sanitary pads.  Do not cross your legs for long periods of time.  Avoid litter boxes and soil used by cats.  Avoid all smoking, herbs, and alcohol.  Avoid drugs not approved by your doctor.  Do not use any tobacco products, including cigarettes, chewing tobacco, and electronic cigarettes. If you need help quitting, ask your doctor. You may get counseling or other support to help you quit.  Visit your dentist. At home, brush your teeth with a soft toothbrush. Be gentle when you floss. GET HELP IF:  You are dizzy.  You have mild cramps or pressure in your lower belly.  You have a nagging pain in your belly area.  You continue to feel sick to your stomach, throw up, or have watery poop (diarrhea).  You have a bad smelling fluid coming from your vagina.  You have pain with peeing (urination).  You have  increased puffiness (swelling) in your face, hands, legs, or ankles. GET HELP RIGHT AWAY IF:   You have a fever.  You are leaking fluid from your vagina.  You have spotting or bleeding from your vagina.  You have very bad belly cramping or pain.  You gain or lose weight rapidly.  You throw up blood. It may look like coffee grounds.  You are around people who have [place] measles, fifth disease, or chickenpox.  You have a very bad headache.  You have shortness of breath.  You have any kind of trauma, such as from a fall or a car accident.   This information is not intended to replace advice given to you by your health care provider. Make sure you discuss any questions you have with your health care provider.   Document Released: 06/09/2007 Document Revised: 01/11/2014 Document Reviewed: 10/31/2012 Elsevier Interactive Patient Education [employer]

## 2014-12-10 NOTE — ED Provider Notes (Addendum)
CSN: [id number]     Arrival date & time 12/10/14  6644 History   First MD Initiated Contact with Patient 12/10/14 1012     Chief Complaint  Patient presents with  . Loss of Consciousness      HPI   patient presents for evaluation of a lightheaded episode with syncope. States she's had some difficulty with morning sickness. She estimates that she is about [redacted] weeks pregnant. Last menstrual period September 14. Not vomiting today. However over the last several weeks has had multiple episodes per day of morning nausea and vomiting. Continues to make urine. No diarrhea. No pain or other symptoms. This morning she was standing to walk towards the bus stop with her daughter from her porch. She became lightheaded. She fell on the grass. Was confused for less than a minute before recovering. Did not bite her tongue. Was not incontinent. No pain. No vaginal bleeding.    History reviewed. No pertinent past medical history. History reviewed. No pertinent past surgical history. Family History  Problem Relation Age of Onset  . Diabetes Maternal Grandmother   . Diabetes Maternal Grandfather   . Diabetes Paternal Grandmother   . Diabetes Paternal Grandfather    Social History  Substance Use Topics  . Smoking status: Former Smoker -- 0.50 packs/day    Types: Cigarettes    Quit date: 10/28/2014  . Smokeless tobacco: None  . Alcohol Use: No   OB History    Gravida Para Term Preterm AB TAB SAB Ectopic Multiple Living   1              Review of Systems  Constitutional: Negative for fever, chills, diaphoresis, appetite change and fatigue.  HENT: Negative for mouth sores, sore throat and trouble swallowing.   Eyes: Negative for visual disturbance.  Respiratory: Negative for cough, chest tightness, shortness of breath and wheezing.   Cardiovascular: Negative for chest pain.  Gastrointestinal: Positive for nausea and vomiting. Negative for abdominal pain, diarrhea and abdominal distention.   Endocrine: Negative for polydipsia, polyphagia and polyuria.  Genitourinary: Negative for dysuria, frequency and hematuria.  Musculoskeletal: Negative for gait problem.  Skin: Negative for color change, pallor and rash.  Neurological: Positive for syncope. Negative for dizziness, light-headedness and headaches.  Hematological: Does not bruise/bleed easily.  Psychiatric/Behavioral: Negative for behavioral problems and confusion.      Allergies  Hydrocodone  Home Medications   Prior to Admission medications   Medication Sig Start Date End Date Taking? Authorizing Provider  ondansetron (ZOFRAN ODT) 4 MG disintegrating tablet Take 1 tablet (4 mg total) by mouth every 8 (eight) hours as needed for nausea. 12/10/14   Rolland Porter, MD   BP 107/63 mmHg  Pulse 91  Temp(Src) 97.6 F (36.4 C)  Resp 21  SpO2 100%  LMP 09/30/2014 Physical Exam  Constitutional: She is oriented to person, place, and time. She appears well-developed and well-nourished. No distress.  HENT:  Head: Normocephalic.  Eyes: Conjunctivae are normal. Pupils are equal, round, and reactive to light. No scleral icterus.  Neck: Normal range of motion. Neck supple. No thyromegaly present.  Cardiovascular: Normal rate and regular rhythm.  Exam reveals no gallop and no friction rub.   No murmur heard. Pulmonary/Chest: Effort normal and breath sounds normal. No respiratory distress. She has no wheezes. She has no rales.  Abdominal: Soft. Bowel sounds are normal. She exhibits no distension. There is no tenderness. There is no rebound.  Musculoskeletal: Normal range of motion.  Neurological: She is  alert and oriented to person, place, and time.  Skin: Skin is warm and dry. No rash noted.  Psychiatric: She has a normal mood and affect. Her behavior is normal.    ED Course  Procedures (including critical care time) Labs Review Labs Reviewed  CBC - Abnormal; Notable for the following:    WBC 16.6 (*)    All other  components within normal limits  URINALYSIS, ROUTINE W REFLEX MICROSCOPIC (NOT AT [facility]) - Abnormal; Notable for the following:    APPearance HAZY (*)    Leukocytes, UA TRACE (*)    All other components within normal limits  URINE MICROSCOPIC-ADD ON - Abnormal; Notable for the following:    Squamous Epithelial / LPF 6-30 (*)    Bacteria, UA FEW (*)    All other components within normal limits  I-STAT BETA HCG BLOOD, ED ([facility], [facility], [facility] ONLY) - Abnormal; Notable for the following:    I-stat hCG, quantitative >2000.0 (*)    All other components within normal limits  BASIC METABOLIC PANEL  CBG MONITORING, ED    Imaging Review No results found. I have personally reviewed and evaluated these images and lab results as part of my medical decision-making.   EKG Interpretation None      MDM   Final diagnoses:  Pregnancy   Pt able to take po fluids here.  Reassuring studies. Plan is home, OB/GYN follow-up. Push fluids.  Bedside limited Ultrasound by myself shows single intrauterine pregnancy with positive fetal heart tones and fetal movement.   Procedure:  Bedside US Indication:  Pregnancy Findings Viable Pregnancy with single IUP. Images archived.   Rolland PorterMark Beren Yniguez, MD 12/10/14 1304  Rolland PorterMark Geneva Pallas, MD 12/26/14 [phone]

## 2014-12-10 NOTE — ED Notes (Addendum)
Patient reports 1 episode of syncope this morning, states she was standing, got dizzy and passed out. Pt reports her boyfriend stated she was unresponsive x2 minutes, pt denies hitting her head and c/o right hip pain. Pt reports minor nausea and lightheadedness at this time. Pt reports she believes she is [redacted] weeks pregnant and that she had to have iron supplements with previous pregnancies.

## 2015-01-05 NOTE — L&D Delivery Note (Signed)
Delivery Note At 7:06 AM a viable female was delivered via Vaginal, Spontaneous Delivery (Presentation: Right Occiput Anterior).  APGAR: 8, 9; weight  .   Placenta status: Intact, Spontaneous.  Cord: 3 vessels with the following complications: None.  Cord pH: not done  Anesthesia: None  Episiotomy: None Lacerations: None Suture Repair: 2.0 Est. Blood Loss (mL): 200  Mom to postpartum.  Baby to Couplet care / Skin to Skin.  Korry Dalgleish A 06/20/2015, 7:21 AM

## 2015-01-30 LAB — OB RESULTS CONSOLE RPR: RPR: NONREACTIVE

## 2015-01-30 LAB — OB RESULTS CONSOLE ABO/RH: RH TYPE: NEGATIVE

## 2015-01-30 LAB — OB RESULTS CONSOLE HIV ANTIBODY (ROUTINE TESTING): HIV: NONREACTIVE

## 2015-01-30 LAB — OB RESULTS CONSOLE HEPATITIS B SURFACE ANTIGEN: HEP B S AG: NEGATIVE

## 2015-01-30 LAB — OB RESULTS CONSOLE GC/CHLAMYDIA
CHLAMYDIA, DNA PROBE: NEGATIVE
GC PROBE AMP, GENITAL: NEGATIVE

## 2015-03-17 ENCOUNTER — Inpatient Hospital Stay (HOSPITAL_COMMUNITY)
Admission: AD | Admit: 2015-03-17 | Discharge: 2015-03-17 | Disposition: A | Discharge status: Home / Self Care | Payer: Medicaid Other | Source: Ambulatory Visit | Attending: Obstetrics | Admitting: Obstetrics

## 2015-03-17 ENCOUNTER — Encounter (HOSPITAL_COMMUNITY): Payer: Self-pay | Admitting: *Deleted

## 2015-03-17 DIAGNOSIS — R102 Pelvic and perineal pain: Secondary | ICD-10-CM | POA: Diagnosis present

## 2015-03-17 DIAGNOSIS — Z87891 Personal history of nicotine dependence: Secondary | ICD-10-CM | POA: Insufficient documentation

## 2015-03-17 DIAGNOSIS — O26892 Other specified pregnancy related conditions, second trimester: Secondary | ICD-10-CM | POA: Diagnosis not present

## 2015-03-17 DIAGNOSIS — O9989 Other specified diseases and conditions complicating pregnancy, childbirth and the puerperium: Secondary | ICD-10-CM | POA: Diagnosis not present

## 2015-03-17 DIAGNOSIS — Z3A24 24 weeks gestation of pregnancy: Secondary | ICD-10-CM | POA: Diagnosis not present

## 2015-03-17 DIAGNOSIS — N949 Unspecified condition associated with female genital organs and menstrual cycle: Secondary | ICD-10-CM

## 2015-03-17 LAB — URINALYSIS, ROUTINE W REFLEX MICROSCOPIC
Bilirubin Urine: NEGATIVE
GLUCOSE, UA: NEGATIVE mg/dL
Hgb urine dipstick: NEGATIVE
KETONES UR: 15 mg/dL — AB
LEUKOCYTES UA: NEGATIVE
NITRITE: NEGATIVE
PROTEIN: NEGATIVE mg/dL
Specific Gravity, Urine: 1.02 (ref 1.005–1.030)
pH: 6 (ref 5.0–8.0)

## 2015-03-17 MED ORDER — ACETAMINOPHEN 500 MG PO TABS
1000.0000 mg | ORAL_TABLET | Freq: Once | ORAL | Status: AC
  Administered 2015-03-17: 1000 mg via ORAL
  Filled 2015-03-17: qty 2

## 2015-03-17 NOTE — MAU Note (Signed)
Pt presents via EMS for pelvic pain and pressure x3 days. Has not taken anything for the pain. States that the pain is worse with movement-describes as sharp and rating 8/10. Denies contractions, vaginal bleeding or LOF. Pt also denies pain with urination but states that she has had an increase in frequency and urgency.

## 2015-03-17 NOTE — Discharge Instructions (Signed)

## 2015-03-17 NOTE — MAU Provider Note (Signed)
History     CSN: [id number]  Arrival date and time: 03/17/15 4098   First Provider Initiated Contact with Patient 03/17/15 0731      Chief Complaint  Patient presents with  . Pelvic Pain   Pelvic Pain The patient's primary symptoms include pelvic pain. This is a new problem. Episode onset: 2 days ago.  The problem occurs intermittently. The problem has been unchanged. Pain severity now: 8/10 when walking. 0/10 when not moving.  The problem affects both sides. She is pregnant. Associated symptoms include abdominal pain. Pertinent negatives include no chills, constipation, diarrhea, dysuria, fever, frequency, nausea, urgency or vomiting. Associated symptoms comments: Patient reports normal fetal movement. . The vaginal discharge was normal (Patient denies leaking of fluid. ). There has been no bleeding. The symptoms are aggravated by activity. She has tried nothing for the symptoms. Sexual activity: patient reports recent intercourse within the last 24 hours.    History reviewed. No pertinent past medical history.  History reviewed. No pertinent past surgical history.  Family History  Problem Relation Age of Onset  . Diabetes Maternal Grandmother   . Diabetes Maternal Grandfather   . Diabetes Paternal Grandmother   . Diabetes Paternal Grandfather     Social History  Substance Use Topics  . Smoking status: Former Smoker -- 0.50 packs/day    Types: Cigarettes    Quit date: 10/28/2014  . Smokeless tobacco: None  . Alcohol Use: No    Allergies:  Allergies  Allergen Reactions  . Hydrocodone Nausea And Vomiting    Prescriptions prior to admission  Medication Sig Dispense Refill Last Dose  . Prenatal Vit-Fe Fumarate-FA (MULTIVITAMIN-PRENATAL) 27-0.8 MG TABS tablet Take 1 tablet by mouth daily at 12 noon.   03/16/2015 at Unknown time  . ondansetron (ZOFRAN ODT) 4 MG disintegrating tablet Take 1 tablet (4 mg total) by mouth every 8 (eight) hours as needed for nausea. 10 tablet 0      Review of Systems  Constitutional: Negative for fever and chills.  Gastrointestinal: Positive for abdominal pain. Negative for nausea, vomiting, diarrhea and constipation.  Genitourinary: Positive for pelvic pain. Negative for dysuria, urgency and frequency.   Physical Exam   Blood pressure 126/57, pulse 105, temperature 98 F (36.7 C), temperature source Oral, resp. rate 18, last menstrual period 09/30/2014, SpO2 100 %.  Physical Exam  Nursing note and vitals reviewed. Constitutional: She is oriented to person, place, and time. She appears well-developed and well-nourished. No distress.  HENT:  Head: Normocephalic.  Cardiovascular: Normal rate.   Respiratory: Effort normal.  GI: Soft. There is no tenderness. There is no rebound.  Genitourinary:  Cervix: ft@extos /closed@intos /thick/ballotable/posterior  Neurological: She is alert and oriented to person, place, and time.  Skin: Skin is warm and dry.  Psychiatric: She has a normal mood and affect.   FHT: 135, moderate with 10x10 accels, no decels Toco: no UCs  MAU Course  Procedures  MDM   Assessment and Plan   1. Round ligament pain   2. [redacted] weeks gestation of pregnancy    DC home Comfort measures reviewed  3rd Trimester precautions  PTL precautions  Fetal kick counts RX: none, OTC tylenol recommended.  Return to MAU as needed FU with OB as planned  Follow-up Information    Follow up with Kathreen Cosier, MD.   Specialty:  Obstetrics and Gynecology   Why:  As scheduled   Contact information:   [address] [place] [address] [phone]  Tawnya CrookHogan, Edwardo Wojnarowski Donovan 03/17/2015, 7:32 AM

## 2015-06-05 LAB — OB RESULTS CONSOLE GBS: GBS: NEGATIVE

## 2015-06-09 ENCOUNTER — Other Ambulatory Visit (HOSPITAL_COMMUNITY): Payer: Self-pay | Admitting: Obstetrics

## 2015-06-09 DIAGNOSIS — Z3689 Encounter for other specified antenatal screening: Secondary | ICD-10-CM

## 2015-06-13 ENCOUNTER — Other Ambulatory Visit (HOSPITAL_COMMUNITY): Payer: Self-pay | Admitting: Obstetrics

## 2015-06-13 ENCOUNTER — Ambulatory Visit (HOSPITAL_COMMUNITY)
Admission: RE | Admit: 2015-06-13 | Discharge: 2015-06-13 | Disposition: A | Discharge status: Home / Self Care | Payer: Medicaid Other | Source: Ambulatory Visit | Attending: Obstetrics | Admitting: Obstetrics

## 2015-06-13 ENCOUNTER — Encounter (HOSPITAL_COMMUNITY): Payer: Self-pay

## 2015-06-13 DIAGNOSIS — Z3689 Encounter for other specified antenatal screening: Secondary | ICD-10-CM

## 2015-06-13 DIAGNOSIS — Z3A37 37 weeks gestation of pregnancy: Secondary | ICD-10-CM | POA: Insufficient documentation

## 2015-06-13 DIAGNOSIS — Z36 Encounter for antenatal screening of mother: Secondary | ICD-10-CM | POA: Diagnosis not present

## 2015-06-20 ENCOUNTER — Encounter (HOSPITAL_COMMUNITY): Payer: Self-pay | Admitting: *Deleted

## 2015-06-20 ENCOUNTER — Inpatient Hospital Stay (HOSPITAL_COMMUNITY)
Admission: AD | Admit: 2015-06-20 | Discharge: 2015-06-22 | DRG: 775 | Disposition: A | Discharge status: Home / Self Care | Payer: Medicaid Other | Source: Ambulatory Visit | Attending: Obstetrics | Admitting: Obstetrics

## 2015-06-20 DIAGNOSIS — IMO0001 Reserved for inherently not codable concepts without codable children: Secondary | ICD-10-CM

## 2015-06-20 DIAGNOSIS — Z3A38 38 weeks gestation of pregnancy: Secondary | ICD-10-CM

## 2015-06-20 LAB — CBC
HCT: 33.9 % — ABNORMAL LOW (ref 36.0–46.0)
HEMOGLOBIN: 11.7 g/dL — AB (ref 12.0–15.0)
MCH: 29.9 pg (ref 26.0–34.0)
MCHC: 34.5 g/dL (ref 30.0–36.0)
MCV: 86.7 fL (ref 78.0–100.0)
PLATELETS: 210 10*3/uL (ref 150–400)
RBC: 3.91 MIL/uL (ref 3.87–5.11)
RDW: 13.8 % (ref 11.5–15.5)
WBC: 17.7 10*3/uL — ABNORMAL HIGH (ref 4.0–10.5)

## 2015-06-20 LAB — OB RESULTS CONSOLE ABO/RH: RH TYPE: POSITIVE

## 2015-06-20 LAB — TYPE AND SCREEN
ABO/RH(D): AB POS
ANTIBODY SCREEN: NEGATIVE

## 2015-06-20 LAB — ABO/RH: ABO/RH(D): AB POS

## 2015-06-20 MED ORDER — PRENATAL MULTIVITAMIN CH
1.0000 | ORAL_TABLET | Freq: Every day | ORAL | Status: DC
  Administered 2015-06-21: 1 via ORAL
  Filled 2015-06-20: qty 1

## 2015-06-20 MED ORDER — OXYTOCIN BOLUS FROM INFUSION: 500.0000 mL | INTRAVENOUS | Status: DC

## 2015-06-20 MED ORDER — LIDOCAINE HCL (PF) 1 % IJ SOLN
30.0000 mL | INTRAMUSCULAR | Status: DC | PRN
  Filled 2015-06-20: qty 30

## 2015-06-20 MED ORDER — BENZOCAINE-MENTHOL 20-0.5 % EX AERO: 1.0000 "application " | INHALATION_SPRAY | CUTANEOUS | Status: DC | PRN

## 2015-06-20 MED ORDER — ZOLPIDEM TARTRATE 5 MG PO TABS: 5.0000 mg | ORAL_TABLET | Freq: Every evening | ORAL | Status: DC | PRN

## 2015-06-20 MED ORDER — DIBUCAINE 1 % RE OINT: 1.0000 "application " | TOPICAL_OINTMENT | RECTAL | Status: DC | PRN

## 2015-06-20 MED ORDER — SIMETHICONE 80 MG PO CHEW: 80.0000 mg | CHEWABLE_TABLET | ORAL | Status: DC | PRN

## 2015-06-20 MED ORDER — SOD CITRATE-CITRIC ACID 500-334 MG/5ML PO SOLN: 30.0000 mL | ORAL | Status: DC | PRN

## 2015-06-20 MED ORDER — ONDANSETRON HCL 4 MG/2ML IJ SOLN: 4.0000 mg | Freq: Four times a day (QID) | INTRAMUSCULAR | Status: DC | PRN

## 2015-06-20 MED ORDER — WITCH HAZEL-GLYCERIN EX PADS: 1.0000 "application " | MEDICATED_PAD | CUTANEOUS | Status: DC | PRN

## 2015-06-20 MED ORDER — FERROUS SULFATE 325 (65 FE) MG PO TABS
325.0000 mg | ORAL_TABLET | Freq: Two times a day (BID) | ORAL | Status: DC
  Administered 2015-06-21: 325 mg via ORAL
  Filled 2015-06-20 (×5): qty 1

## 2015-06-20 MED ORDER — IBUPROFEN 600 MG PO TABS
600.0000 mg | ORAL_TABLET | Freq: Four times a day (QID) | ORAL | Status: DC
  Administered 2015-06-20 – 2015-06-22 (×7): 600 mg via ORAL
  Filled 2015-06-20 (×8): qty 1

## 2015-06-20 MED ORDER — DIPHENHYDRAMINE HCL 25 MG PO CAPS: 25.0000 mg | ORAL_CAPSULE | Freq: Four times a day (QID) | ORAL | Status: DC | PRN

## 2015-06-20 MED ORDER — OXYTOCIN 10 UNIT/ML IJ SOLN
INTRAMUSCULAR | Status: AC
  Administered 2015-06-20: 10 [IU]
  Filled 2015-06-20: qty 1

## 2015-06-20 MED ORDER — ACETAMINOPHEN 325 MG PO TABS: 650.0000 mg | ORAL_TABLET | ORAL | Status: DC | PRN

## 2015-06-20 MED ORDER — LACTATED RINGERS IV SOLN: 500.0000 mL | INTRAVENOUS | Status: DC | PRN

## 2015-06-20 MED ORDER — TETANUS-DIPHTH-ACELL PERTUSSIS 5-2.5-18.5 LF-MCG/0.5 IM SUSP: 0.5000 mL | Freq: Once | INTRAMUSCULAR | Status: DC

## 2015-06-20 MED ORDER — ONDANSETRON HCL 4 MG/2ML IJ SOLN: 4.0000 mg | INTRAMUSCULAR | Status: DC | PRN

## 2015-06-20 MED ORDER — ONDANSETRON HCL 4 MG PO TABS: 4.0000 mg | ORAL_TABLET | ORAL | Status: DC | PRN

## 2015-06-20 MED ORDER — FLEET ENEMA 7-19 GM/118ML RE ENEM
1.0000 | ENEMA | RECTAL | Status: DC | PRN
Start: 2015-06-20 — End: 2015-06-22

## 2015-06-20 MED ORDER — SENNOSIDES-DOCUSATE SODIUM 8.6-50 MG PO TABS
2.0000 | ORAL_TABLET | ORAL | Status: DC
  Administered 2015-06-20 – 2015-06-22 (×2): 2 via ORAL
  Filled 2015-06-20 (×2): qty 2

## 2015-06-20 MED ORDER — LIDOCAINE HCL (PF) 1 % IJ SOLN
INTRAMUSCULAR | Status: AC
  Filled 2015-06-20: qty 30

## 2015-06-20 MED ORDER — COCONUT OIL OIL: 1.0000 "application " | TOPICAL_OIL | Status: DC | PRN

## 2015-06-20 MED ORDER — LACTATED RINGERS IV SOLN: INTRAVENOUS | Status: DC

## 2015-06-20 MED ORDER — OXYTOCIN 40 UNITS IN LACTATED RINGERS INFUSION - SIMPLE MED: 2.5000 [IU]/h | INTRAVENOUS | Status: DC

## 2015-06-20 MED ORDER — IBUPROFEN 600 MG PO TABS: 600.0000 mg | ORAL_TABLET | Freq: Four times a day (QID) | ORAL | Status: DC

## 2015-06-20 NOTE — Lactation Note (Addendum)
This note was copied from a baby's chart. Lactation Consultation Note  Patient Name: Kathleen Chapman Today's Date: 06/20/2015 Reason for consult: Initial assessmentwith this mom and term baby, now 716 hours old. The baby was tongue sucking. At this time, hand expression into a spoon was done, and the baby was spoon fed 5 ml's of colostrum.  With latch, mom had severe pain, and her nipple was severely pinched. On further exam of baby's mouth,at 1430  while the baby was in the CNS under a warmer, I noted a heart shaped tongue, anterior short frenulum. Dr. Margo AyeHall was in the CNS, and agreed to look at baby's tongue. Parents have not been told the baby has a "tongue -tie", but know latch is painful, and baby has been spoon fed. Mom is fine with pumping and bottle feeding, which is what she did with her son. DEP set up for mom, she pumped and expressed an additional 5 ml's of colostrum. This was being fed by the CNS in the CNS.  [facility]   Maternal Data Formula Feeding for Exclusion: Yes Reason for exclusion: Mother's choice to formula and breast feed on admission Has patient been taught Hand Expression?: Yes Does the patient have breastfeeding experience prior to this delivery?: Yes  Feeding Feeding Type: Breast Milk  LATCH Score/Interventions Latch: Repeated attempts needed to sustain latch, nipple held in mouth throughout feeding, stimulation needed to elicit sucking reflex. (tongue sucking, heart shaped tongue, mom) Intervention(s): Skin to skin;Teach feeding cues;Waking techniques Intervention(s): Adjust position;Assist with latch  Audible Swallowing: None (lots of easily expressed colostrum)  Type of Nipple: Everted at rest and after stimulation (semi fllat, soft, compressible)  Comfort (Breast/Nipple): Filling, red/small blisters or bruises, mild/mod discomfort (nipple severely pinched after latch, painful for mom)  Problem noted:  Severe discomfort Interventions (Severe discomfort): Double electric pum;Observe pumping;Flange size (24 flange)  Hold (Positioning): Assistance needed to correctly position infant at breast and maintain latch. Intervention(s): Breastfeeding basics reviewed;Support Pillows;Position options;Skin to skin  LATCH Score: 5  Lactation Tools Discussed/Used WIC Program: No (fax sent  ofr mom to apply and get DEP) Pump Review: Setup, frequency, and cleaning;Milk Storage;Other (comment) (pump setings, hand expression) Initiated by:: c Nedra HaiLee RN IBCLC Date initiated:: 06/20/15   Consult Status Consult Status: Follow-up Date: 06/21/15 Follow-up type: In-patient    Alfred LevinsLee, [name] 06/20/2015, 2:35 PM

## 2015-06-20 NOTE — H&P (Signed)
This is Dr. Francoise CeoBernard Roxie Kreeger dictating the history and physical on Kathleen Chapman  she is a [age] year old gravida 3 para 1011 at 6838 weeks and 3 days EDC 07/01/2015 negative GBS she is admitted fully dilated interrupted membranes at a normal vaginal liver female Apgar 8 and 9% intact no episiotomy or laceration past medical Past medical history negative Past surgical history negative Social history negative System review negative System physical exam well-developed female post delivery HEENT negative Lungs clear to P&A Heart regular rhythm no murmurs no gallops Breasts negative Abdomen uterus 20 week postpartum size Extremities negative a

## 2015-06-20 NOTE — MAU Note (Signed)
PT  SAYS SHE THINKS  SROM   AT 0555-   CLEAR  FLUID.      VE IN OFFICE   2  CM.     DENIES HSV AND MRSA.    GBS- UNSURE

## 2015-06-21 LAB — CBC
HEMATOCRIT: 33.2 % — AB (ref 36.0–46.0)
HEMOGLOBIN: 11.4 g/dL — AB (ref 12.0–15.0)
MCH: 29.7 pg (ref 26.0–34.0)
MCHC: 34.3 g/dL (ref 30.0–36.0)
MCV: 86.5 fL (ref 78.0–100.0)
Platelets: 209 10*3/uL (ref 150–400)
RBC: 3.84 MIL/uL — AB (ref 3.87–5.11)
RDW: 13.8 % (ref 11.5–15.5)
WBC: 14.7 10*3/uL — ABNORMAL HIGH (ref 4.0–10.5)

## 2015-06-21 LAB — RPR: RPR: NONREACTIVE

## 2015-06-21 NOTE — Progress Notes (Signed)
Patient ID: Kathleen HerrlichRodricka Chapman, female   DOB: 03-Sep-1990, 25 y.o.   MRN: [id number] Postpartum day 1 Blood pressure 129/61 pulse 74 respiration 16 Fundus firm Lochia moderate Legs negative doing well

## 2015-06-22 NOTE — Discharge Instructions (Signed)
if  bleeding more than a period notify me  °No sex for 3 weeks °See me in 3 weeks  °Resume normal activity in am °

## 2015-06-22 NOTE — Lactation Note (Signed)
This note was copied from a baby's chart. Lactation Consultation Note  Devon RN states she reviewed engorgement care and discharge teaching with mother and does not need consult. Mother has manual pump [facility]  Patient Name: Kathleen Lyndle HerrlichRodricka Gibbon EAVWU'JToday's Date: 06/22/2015     Maternal Data    Feeding Feeding Type: Bottle Fed - Formula Nipple Type: Slow - flow  LATCH Score/Interventions                      Lactation Tools Discussed/Used     Consult Status      Hardie PulleyBerkelhammer, Eve Rey Boschen 06/22/2015, 9:56 AM

## 2015-06-22 NOTE — Discharge Summary (Signed)
Obstetric Discharge Summary Reason for Admission: onset of labor Prenatal Procedures: none Intrapartum Procedures: spontaneous vaginal delivery Postpartum Procedures: none Complications-Operative and Postpartum: none HEMOGLOBIN  Date Value Ref Range Status  06/21/2015 11.4* 12.0 - 15.0 g/dL Final   HCT  Date Value Ref Range Status  06/21/2015 33.2* 36.0 - 46.0 % Final    Physical Exam:  General: alert Lochia: appropriate Uterine Fundus: firm Incision: healing well DVT Evaluation: No evidence of DVT seen on physical exam.  Discharge Diagnoses: Term Pregnancy-delivered  Discharge Information: Date: 06/22/2015 Activity: pelvic rest Diet: routine Medications: Tylenol #3 Condition: improved Instructions: refer to practice specific booklet Discharge to: home Follow-up Information    Follow up with HARPER,CHARLES A, MD.   Specialty:  Obstetrics and Gynecology   Contact information:   [address] [place] [address] [phone]       Newborn Data: Live born female  Birth Weight: 6 lb 2.8 oz (2800 g) APGAR: 8, 9  Home with mother.  Dick Hark A 06/22/2015, 4:49 AM

## 2015-07-25 ENCOUNTER — Encounter (HOSPITAL_COMMUNITY): Payer: Self-pay

## 2015-08-27 ENCOUNTER — Ambulatory Visit: Payer: Medicaid Other | Admitting: Obstetrics and Gynecology

## 2015-09-29 ENCOUNTER — Encounter: Payer: Self-pay | Admitting: *Deleted

## 2015-09-29 LAB — PROCEDURE REPORT - SCANNED: PAP SMEAR: NEGATIVE

## 2016-01-05 NOTE — L&D Delivery Note (Signed)
[name] at 7767w4d presented in active labor at complete, complete, with vertex presentation, after uncomplicated pregnancy. Taken promptly to L&D, no IV, FHR cat I, AROM= Clear, presentation OP that rotated during delivery.     Delivery Note;   At 8:34 AM a viable and healthy female was delivered via Vaginal, Spontaneous Delivery (Presentation: ; Vertex, OP ).  APGAR: 9/9, ; weight  .  Pending  Placenta status: INtact, schultz presentation , .  Cord: 3VC with the following complications: vertex rotated LOP to LOA as baby descended..  Cord pH: n/a  Anesthesia: none  Episiotomy: None Lacerations: none  Suture Repair:  Est. Blood Loss (mL):  400 ml IM pitocin 10 units., due to no iv. Mom to postpartum.  Baby to Couplet care / Skin to Skin.  Kathleen Chapman,Kathleen Chapman, together with Sharrell KuProsper Amponsah MS III. 10/28/2016, 8:44 AM

## 2016-03-19 ENCOUNTER — Emergency Department (HOSPITAL_COMMUNITY)
Admission: EM | Admit: 2016-03-19 | Discharge: 2016-03-19 | Disposition: A | Discharge status: Home / Self Care | Payer: Medicaid Other | Attending: Physician Assistant | Admitting: Physician Assistant

## 2016-03-19 ENCOUNTER — Encounter (HOSPITAL_COMMUNITY): Payer: Self-pay

## 2016-03-19 ENCOUNTER — Other Ambulatory Visit (HOSPITAL_COMMUNITY): Payer: Medicaid Other

## 2016-03-19 DIAGNOSIS — O9989 Other specified diseases and conditions complicating pregnancy, childbirth and the puerperium: Secondary | ICD-10-CM | POA: Insufficient documentation

## 2016-03-19 DIAGNOSIS — O009 Unspecified ectopic pregnancy without intrauterine pregnancy: Secondary | ICD-10-CM | POA: Diagnosis not present

## 2016-03-19 DIAGNOSIS — M25571 Pain in right ankle and joints of right foot: Secondary | ICD-10-CM | POA: Diagnosis not present

## 2016-03-19 DIAGNOSIS — Z3A01 Less than 8 weeks gestation of pregnancy: Secondary | ICD-10-CM | POA: Insufficient documentation

## 2016-03-19 DIAGNOSIS — O9A211 Injury, poisoning and certain other consequences of external causes complicating pregnancy, first trimester: Secondary | ICD-10-CM | POA: Diagnosis not present

## 2016-03-19 DIAGNOSIS — Y999 Unspecified external cause status: Secondary | ICD-10-CM | POA: Diagnosis not present

## 2016-03-19 DIAGNOSIS — O0281 Inappropriate change in quantitative human chorionic gonadotropin (hCG) in early pregnancy: Secondary | ICD-10-CM | POA: Diagnosis not present

## 2016-03-19 DIAGNOSIS — Z87891 Personal history of nicotine dependence: Secondary | ICD-10-CM | POA: Diagnosis not present

## 2016-03-19 DIAGNOSIS — Y929 Unspecified place or not applicable: Secondary | ICD-10-CM | POA: Insufficient documentation

## 2016-03-19 DIAGNOSIS — R109 Unspecified abdominal pain: Secondary | ICD-10-CM | POA: Insufficient documentation

## 2016-03-19 DIAGNOSIS — X501XXA Overexertion from prolonged static or awkward postures, initial encounter: Secondary | ICD-10-CM | POA: Diagnosis not present

## 2016-03-19 DIAGNOSIS — Y939 Activity, unspecified: Secondary | ICD-10-CM | POA: Insufficient documentation

## 2016-03-19 DIAGNOSIS — Z3490 Encounter for supervision of normal pregnancy, unspecified, unspecified trimester: Secondary | ICD-10-CM

## 2016-03-19 DIAGNOSIS — W19XXXA Unspecified fall, initial encounter: Secondary | ICD-10-CM

## 2016-03-19 LAB — ABO/RH: ABO/RH(D): AB POS

## 2016-03-19 LAB — HCG, QUANTITATIVE, PREGNANCY: HCG, BETA CHAIN, QUANT, S: 127169 m[IU]/mL — AB (ref <5)

## 2016-03-19 LAB — POC URINE PREG, ED: PREG TEST UR: POSITIVE — AB

## 2016-03-19 NOTE — ED Provider Notes (Signed)
[facility]ENCY DEPT Provider Note   CSN: [id number] Arrival date & time: 03/19/16  4098     History   Chief Complaint Chief Complaint  Patient presents with  . Fall    HPI Kathleen Chapman is a 26 y.o. female.  Patient is a 26 year old female with new pregnancy presenting today after fall. Patient reports that she twisted her ankle and has had some mild abdominal crampy. No bleeding. She would like to make sure that her pregnancy is going along well. She has not had any ultrasound for location yet. Patient believes that she 6-[redacted] weeks pregnant.  She is able to walk on her ankle afterwards. Has no oozing or swelling. Patient believes that she is just twisted.  The history is provided by the patient.    History reviewed. No pertinent past medical history.  Patient Active Problem List   Diagnosis Date Noted  . Active labor at term 06/20/2015  . NVD (normal vaginal delivery) 06/20/2015    History reviewed. No pertinent surgical history.  OB History    Gravida Para Term Preterm AB Living   3 1 1   1 2    SAB TAB Ectopic Multiple Live Births   1     0 2       Home Medications    Prior to Admission medications   Not on File    Family History Family History  Problem Relation Age of Onset  . Diabetes Maternal Grandmother   . Diabetes Maternal Grandfather   . Diabetes Paternal Grandmother   . Diabetes Paternal Grandfather     Social History Social History  Substance Use Topics  . Smoking status: Former Smoker    Packs/day: 0.50    Types: Cigarettes    Quit date: 10/28/2014  . Smokeless tobacco: Not on file  . Alcohol use No     Allergies   Hydrocodone   Review of Systems Review of Systems  Constitutional: Negative for activity change.  Respiratory: Negative for shortness of breath.   Cardiovascular: Negative for chest pain.  Gastrointestinal: Positive for abdominal pain.  All other systems reviewed and are negative.    Physical Exam Updated  Vital Signs BP 125/71 (BP Location: Left Arm)   Pulse 91   Temp 97.9 F (36.6 C) (Oral)   Resp 16   Ht 5' (1.524 m)   Wt 148 lb (67.1 kg)   LMP 01/28/2016 (Within Days)   SpO2 100%   BMI 28.90 kg/m   Physical Exam  Constitutional: She is oriented to person, place, and time. She appears well-developed and well-nourished.  HENT:  Head: Normocephalic and atraumatic.  Eyes: Right eye exhibits no discharge.  Cardiovascular: Normal rate and regular rhythm.   No murmur heard. Pulmonary/Chest: Effort normal and breath sounds normal. No respiratory distress.  Abdominal: Soft. She exhibits no distension. There is no tenderness.  Musculoskeletal:  Right ankle with no swelling. Mild tenderness to lateral malleolus. Patient is ambulatory  Neurological: She is oriented to person, place, and time.  Skin: Skin is warm and dry. She is not diaphoretic.  Psychiatric: She has a normal mood and affect.  Nursing note and vitals reviewed.    ED Treatments / Results  Labs (all labs ordered are listed, but only abnormal results are displayed) Labs Reviewed  HCG, QUANTITATIVE, PREGNANCY  ABO/RH    EKG  EKG Interpretation None       Radiology No results found.  Procedures Procedures (including critical care time)  Medications Ordered in ED  Medications - No data to display   Initial Impression / Assessment and Plan / ED Course  I have reviewed the triage vital signs and the nursing notes.  Pertinent labs & imaging results that were available during my care of the patient were reviewed by me and considered in my medical decision making (see chart for details).     She is a 26 year old female presenting after twisting her ankle. Patient had one episode of mild, cramping in his here to document pregnancy. We'll do transvaginal ultrasound, get bhCG and ABO type. Patient did not have any bleeding therefore do not think she requires rhogam at this time. Patient does not require an ankle  x-ray based on [place] ankle rules.   11:12 AM  Patient like to leave before getting her transvaginal ultrasound. I think this is reasonable. Given that the fetus is likely previable. We'll have her follow-up with her OB/GYN to confirm location. Patient does not have any additional cramping except for the moment she fell. Therefore do not suspect ectopic, as the cramping was related temporally to the fall. We explained risk to patietn and if she any additional cramping will have her return immediately to the emergency department.   Final Clinical Impressions(s) / ED Diagnoses   Final diagnoses:  Ectopic pregnancy  Ectopic pregnancy    New Prescriptions New Prescriptions   No medications on file     Lakela Kuba Randall AnLyn Jahmil Macleod, MD 03/19/16 1114

## 2016-03-19 NOTE — ED Notes (Signed)
Spoke with ultrasound about needed test. They stated that they needed a positive pregnancy test. HCG was not resulted, did a urine preg, it was positive. Called US and they said they would get her when they can

## 2016-03-19 NOTE — ED Triage Notes (Signed)
Pt presents for evaluation of fall occurring yesterday. Pt reports she twisted her R ankle but is concerned about small amount of R sided abd cramping. Pt reports she is pregnant but unsure of how far along. LMP 1/24, no vaginal bleeding.

## 2016-03-19 NOTE — ED Notes (Signed)
Papers reviewed with MD and patient. They had to leave with to pick up their son so no US done today. Dr. Corlis LeakMackuen spoke with them about risks and they express intent to follow up

## 2016-03-19 NOTE — Discharge Instructions (Signed)
You're leaving before you get your transvaginal ultrasound. This means that we don't know the pregnancy is in the right place. We want you to follow up with your OB/GYN to make sure that it is located in the right place If you have any cramping, bleeding, pain please return immediately to get an ultrasound.  Take prenatal vitamens.

## 2016-05-25 ENCOUNTER — Encounter: Payer: Self-pay | Admitting: Obstetrics

## 2016-05-25 ENCOUNTER — Other Ambulatory Visit (HOSPITAL_COMMUNITY)
Admission: RE | Admit: 2016-05-25 | Discharge: 2016-05-25 | Disposition: A | Discharge status: Home / Self Care | Payer: Medicaid Other | Source: Ambulatory Visit | Attending: Obstetrics | Admitting: Obstetrics

## 2016-05-25 ENCOUNTER — Ambulatory Visit (INDEPENDENT_AMBULATORY_CARE_PROVIDER_SITE_OTHER): Payer: Medicaid Other | Admitting: Obstetrics

## 2016-05-25 VITALS — BP 115/67 | HR 88 | Wt 133.2 lb

## 2016-05-25 DIAGNOSIS — O219 Vomiting of pregnancy, unspecified: Secondary | ICD-10-CM | POA: Diagnosis not present

## 2016-05-25 DIAGNOSIS — Z3490 Encounter for supervision of normal pregnancy, unspecified, unspecified trimester: Secondary | ICD-10-CM | POA: Diagnosis present

## 2016-05-25 DIAGNOSIS — Z3482 Encounter for supervision of other normal pregnancy, second trimester: Secondary | ICD-10-CM | POA: Diagnosis not present

## 2016-05-25 DIAGNOSIS — Z3492 Encounter for supervision of normal pregnancy, unspecified, second trimester: Secondary | ICD-10-CM | POA: Diagnosis not present

## 2016-05-25 DIAGNOSIS — O09212 Supervision of pregnancy with history of pre-term labor, second trimester: Secondary | ICD-10-CM | POA: Diagnosis not present

## 2016-05-25 DIAGNOSIS — Z3A16 16 weeks gestation of pregnancy: Secondary | ICD-10-CM | POA: Diagnosis not present

## 2016-05-25 DIAGNOSIS — R12 Heartburn: Secondary | ICD-10-CM

## 2016-05-25 DIAGNOSIS — O26892 Other specified pregnancy related conditions, second trimester: Secondary | ICD-10-CM

## 2016-05-25 DIAGNOSIS — O26899 Other specified pregnancy related conditions, unspecified trimester: Secondary | ICD-10-CM

## 2016-05-25 DIAGNOSIS — Z3480 Encounter for supervision of other normal pregnancy, unspecified trimester: Secondary | ICD-10-CM

## 2016-05-25 MED ORDER — DOXYLAMINE-PYRIDOXINE 10-10 MG PO TBEC: DELAYED_RELEASE_TABLET | ORAL | 5 refills | Status: DC

## 2016-05-25 MED ORDER — OMEPRAZOLE 20 MG PO CPDR: 20.0000 mg | DELAYED_RELEASE_CAPSULE | Freq: Two times a day (BID) | ORAL | 5 refills | Status: DC

## 2016-05-25 NOTE — Progress Notes (Addendum)
Subjective:    Kathleen HerrlichRodricka Chapman is being seen today for her first obstetrical visit.  This is not a planned pregnancy. She is at 4150w2d gestation. Her obstetrical history is significant for none. Relationship with FOB: significant other, living together. Patient does intend to breast feed. Pregnancy history fully reviewed.  The information documented in the HPI was reviewed and verified.  Menstrual History: OB History    Gravida Para Term Preterm AB Living   4 2 2   1 2    SAB TAB Ectopic Multiple Live Births   1     0 2       Patient's last menstrual period was 02/01/2016.    History reviewed. No pertinent past medical history.  History reviewed. No pertinent surgical history.   (Not in a hospital admission) Allergies  Allergen Reactions  . Hydrocodone Nausea And Vomiting    Social History  Substance Use Topics  . Smoking status: Former Smoker    Packs/day: 0.50    Types: Cigarettes    Quit date: 10/28/2014  . Smokeless tobacco: Never Used  . Alcohol use No     Comment: socially    Family History  Problem Relation Age of Onset  . Diabetes Maternal Grandmother   . Diabetes Maternal Grandfather   . Diabetes Paternal Grandmother   . Diabetes Paternal Grandfather   . Hypertension Mother      Review of Systems Constitutional: negative for weight loss Gastrointestinal: negative for vomiting Genitourinary:negative for genital lesions and vaginal discharge and dysuria Musculoskeletal:negative for back pain Behavioral/Psych: negative for abusive relationship, depression, illegal drug usage and tobacco use    Objective:    BP 115/67   Pulse 88   Wt 133 lb 3.2 oz (60.4 kg)   LMP 02/01/2016   BMI 26.01 kg/m  General Appearance:    Alert, cooperative, no distress, appears stated age  Head:    Normocephalic, without obvious abnormality, atraumatic  Eyes:    PERRL, conjunctiva/corneas clear, EOM's intact, fundi    benign, both eyes  Ears:    Normal TM's and external  ear canals, both ears  Nose:   Nares normal, septum midline, mucosa normal, no drainage    or sinus tenderness  Throat:   Lips, mucosa, and tongue normal; teeth and gums normal  Neck:   Supple, symmetrical, trachea midline, no adenopathy;    thyroid:  no enlargement/tenderness/nodules; no carotid   bruit or JVD  Back:     Symmetric, no curvature, ROM normal, no CVA tenderness  Lungs:     Clear to auscultation bilaterally, respirations unlabored  Chest Wall:    No tenderness or deformity   Heart:    Regular rate and rhythm, S1 and S2 normal, no murmur, rub   or gallop  Breast Exam:    No tenderness, masses, or nipple abnormality  Abdomen:     Soft, non-tender, bowel sounds active all four quadrants,    no masses, no organomegaly  Genitalia:    Normal female without lesion, discharge or tenderness  Extremities:   Extremities normal, atraumatic, no cyanosis or edema  Pulses:   2+ and symmetric all extremities  Skin:   Skin color, texture, turgor normal, no rashes or lesions  Lymph nodes:   Cervical, supraclavicular, and axillary nodes normal  Neurologic:   CNII-XII intact, normal strength, sensation and reflexes    throughout      Lab Review Urine pregnancy test Labs reviewed yes Radiologic studies reviewed no  Assessment:  Pregnancy at [redacted]w[redacted]d weeks    ? Preterm Cervical Effacement with previous pregnancy, but delivery at term  Nausea and vomiting  Heartburn   Plan:     1. Encounter for supervision of normal pregnancy, antepartum, unspecified gravidity Rx: - Culture, OB Urine - Cystic Fibrosis Mutation 97 - Hemoglobinopathy evaluation - Obstetric Panel, Including HIV - VITAMIN D 25 Hydroxy (Vit-D Deficiency, Fractures) - Varicella zoster antibody, IgG - Cervicovaginal ancillary only  2. Heartburn during pregnancy, antepartum Rx: - omeprazole (PRILOSEC) 20 MG capsule; Take 1 capsule (20 mg total) by mouth 2 (two) times daily before a meal.  Dispense: 60 capsule; Refill:  5  3. Nausea and vomiting during pregnancy prior to [redacted] weeks gestation Rx: - Doxylamine-Pyridoxine (DICLEGIS) 10-10 MG TBEC; 1 tab in AM, 1 tab mid afternoon 2 tabs at bedtime. Max dose 4 tabs daily.  Dispense: 100 tablet; Refill: 5  4. Current pregnancy with history of pre-term labor in second trimester Rx: - [place] MFM OB COMP + 14 WK; Future - [place] MFM OB Transvaginal; Future  5. Supervision of other normal pregnancy, antepartum  Counseling provided regarding continued use of seat belts, cessation of alcohol consumption, smoking or use of illicit drugs; infection precautions i.e., influenza/TDAP immunizations, toxoplasmosis,CMV, parvovirus, listeria and varicella; workplace safety, exercise during pregnancy; routine dental care, safe medications, sexual activity, hot tubs, saunas, pools, travel, caffeine use, fish and methlymercury, potential toxins, hair treatments, varicose veins Weight gain recommendations per IOM guidelines reviewed: underweight/BMI< 18.5--> gain 28 - 40 lbs; normal weight/BMI 18.5 - 24.9--> gain 25 - 35 lbs; overweight/BMI 25 - 29.9--> gain 15 - 25 lbs; obese/BMI >30->gain  11 - 20 lbs Problem list reviewed and updated. FIRST/CF mutation testing/NIPT/QUAD SCREEN/fragile X/Ashkenazi Jewish population testing/Spinal muscular atrophy discussed: declined Role of ultrasound in pregnancy discussed; fetal survey: requested. Amniocentesis discussed: not indicated.   Meds ordered this encounter  Medications  . omeprazole (PRILOSEC) 20 MG capsule    Sig: Take 1 capsule (20 mg total) by mouth 2 (two) times daily before a meal.    Dispense:  60 capsule    Refill:  5  . Doxylamine-Pyridoxine (DICLEGIS) 10-10 MG TBEC    Sig: 1 tab in AM, 1 tab mid afternoon 2 tabs at bedtime. Max dose 4 tabs daily.    Dispense:  100 tablet    Refill:  5   Orders Placed This Encounter  Procedures  . Culture, OB Urine  . [place] MFM OB COMP + 14 WK    Standing Status:   Future    Standing  Expiration Date:   07/25/2017    Order Specific Question:   Reason for Exam (SYMPTOM  OR DIAGNOSIS REQUIRED)    Answer:   History of preterm cervical effacement    Order Specific Question:   Preferred imaging location?    Answer:   [facility]-Ultrasound  . [place] MFM OB Transvaginal    Standing Status:   Future    Standing Expiration Date:   07/25/2017    Order Specific Question:   Reason for Exam (SYMPTOM  OR DIAGNOSIS REQUIRED)    Answer:   History of preterm cervical effacement    Order Specific Question:   Preferred imaging location?    Answer:   [facility]-Ultrasound  . Cystic Fibrosis Mutation 97  . Hemoglobinopathy evaluation  . Obstetric Panel, Including HIV  . VITAMIN D 25 Hydroxy (Vit-D Deficiency, Fractures)  . Varicella zoster antibody, IgG    Follow up in 4 weeks. 50% of 20 min  visit spent on counseling and coordination of care. Patient ID: Kathleen Chapman, female   DOB: 19-Apr-1990, 26 y.o.   MRN: [id number]

## 2016-05-25 NOTE — Progress Notes (Signed)
Last Pap 09/29/2015 Normal

## 2016-05-26 LAB — CERVICOVAGINAL ANCILLARY ONLY
Bacterial vaginitis: POSITIVE — AB
CANDIDA VAGINITIS: NEGATIVE
CHLAMYDIA, DNA PROBE: NEGATIVE
NEISSERIA GONORRHEA: NEGATIVE
TRICH (WINDOWPATH): NEGATIVE

## 2016-05-27 ENCOUNTER — Other Ambulatory Visit: Payer: Self-pay | Admitting: Obstetrics

## 2016-05-27 DIAGNOSIS — B9689 Other specified bacterial agents as the cause of diseases classified elsewhere: Secondary | ICD-10-CM

## 2016-05-27 DIAGNOSIS — N760 Acute vaginitis: Principal | ICD-10-CM

## 2016-05-27 LAB — OBSTETRIC PANEL, INCLUDING HIV
Antibody Screen: NEGATIVE
BASOS ABS: 0 10*3/uL (ref 0.0–0.2)
BASOS: 0 %
EOS (ABSOLUTE): 0.1 10*3/uL (ref 0.0–0.4)
Eos: 1 %
HEMATOCRIT: 39.3 % (ref 34.0–46.6)
HIV Screen 4th Generation wRfx: NONREACTIVE
Hemoglobin: 12.9 g/dL (ref 11.1–15.9)
Hepatitis B Surface Ag: NEGATIVE
IMMATURE GRANULOCYTES: 0 %
Immature Grans (Abs): 0 10*3/uL (ref 0.0–0.1)
LYMPHS: 22 %
Lymphocytes Absolute: 2.2 10*3/uL (ref 0.7–3.1)
MCH: 30.6 pg (ref 26.6–33.0)
MCHC: 32.8 g/dL (ref 31.5–35.7)
MCV: 93 fL (ref 79–97)
MONOCYTES: 5 %
Monocytes Absolute: 0.5 10*3/uL (ref 0.1–0.9)
Neutrophils Absolute: 7 10*3/uL (ref 1.4–7.0)
Neutrophils: 72 %
PLATELETS: 284 10*3/uL (ref 150–379)
RBC: 4.21 x10E6/uL (ref 3.77–5.28)
RDW: 14.4 % (ref 12.3–15.4)
RPR Ser Ql: NONREACTIVE
RUBELLA: 1.36 {index} (ref >0.99)
Rh Factor: POSITIVE
WBC: 9.8 10*3/uL (ref 3.4–10.8)

## 2016-05-27 LAB — HEMOGLOBINOPATHY EVALUATION
HEMOGLOBIN A2 QUANTITATION: 2.8 % (ref 1.8–3.2)
HEMOGLOBIN F QUANTITATION: 0 % (ref 0.0–2.0)
HGB A: 97.2 % (ref 96.4–98.8)
HGB C: 0 %
HGB S: 0 %
HGB VARIANT: 0 %

## 2016-05-27 LAB — VITAMIN D 25 HYDROXY (VIT D DEFICIENCY, FRACTURES): VIT D 25 HYDROXY: 12.8 ng/mL — AB (ref 30.0–100.0)

## 2016-05-27 LAB — VARICELLA ZOSTER ANTIBODY, IGG: VARICELLA: 300 {index} (ref >165)

## 2016-05-27 LAB — URINE CULTURE, OB REFLEX

## 2016-05-27 LAB — CULTURE, OB URINE

## 2016-05-27 MED ORDER — METRONIDAZOLE 0.75 % VA GEL: 1.0000 | Freq: Two times a day (BID) | VAGINAL | 0 refills | Status: DC

## 2016-06-02 LAB — CYSTIC FIBROSIS MUTATION 97: GENE DIS ANAL CARRIER INTERP BLD/T-IMP: NOT DETECTED

## 2016-06-11 ENCOUNTER — Other Ambulatory Visit: Payer: Self-pay | Admitting: Obstetrics

## 2016-06-11 ENCOUNTER — Ambulatory Visit (HOSPITAL_COMMUNITY)
Admission: RE | Admit: 2016-06-11 | Discharge: 2016-06-11 | Disposition: A | Discharge status: Home / Self Care | Payer: Medicaid Other | Source: Ambulatory Visit | Attending: Obstetrics | Admitting: Obstetrics

## 2016-06-11 DIAGNOSIS — Z3689 Encounter for other specified antenatal screening: Secondary | ICD-10-CM | POA: Insufficient documentation

## 2016-06-11 DIAGNOSIS — Z3A18 18 weeks gestation of pregnancy: Secondary | ICD-10-CM | POA: Diagnosis not present

## 2016-06-11 DIAGNOSIS — O09212 Supervision of pregnancy with history of pre-term labor, second trimester: Secondary | ICD-10-CM

## 2016-06-22 ENCOUNTER — Encounter: Payer: Self-pay | Admitting: Obstetrics

## 2016-06-22 ENCOUNTER — Ambulatory Visit (INDEPENDENT_AMBULATORY_CARE_PROVIDER_SITE_OTHER): Payer: Medicaid Other | Admitting: Obstetrics

## 2016-06-22 VITALS — BP 110/68 | HR 91 | Wt 139.5 lb

## 2016-06-22 DIAGNOSIS — S0990XS Unspecified injury of head, sequela: Secondary | ICD-10-CM

## 2016-06-22 DIAGNOSIS — Z3490 Encounter for supervision of normal pregnancy, unspecified, unspecified trimester: Secondary | ICD-10-CM

## 2016-06-22 DIAGNOSIS — H9192 Unspecified hearing loss, left ear: Secondary | ICD-10-CM

## 2016-06-22 DIAGNOSIS — Z3492 Encounter for supervision of normal pregnancy, unspecified, second trimester: Secondary | ICD-10-CM

## 2016-06-22 NOTE — Progress Notes (Signed)
Subjective:  Kathleen HerrlichRodricka Chapman is a 26 y.o. [id number] at 4853w2d being seen today for ongoing prenatal care.  She is currently monitored for the following issues for this low-risk pregnancy and has Active labor at term; NVD (normal vaginal delivery); and Encounter for supervision of normal pregnancy, unspecified, unspecified trimester on her problem list.  Patient reports being hit up side her head with a basketball, and has had decreased hearing since then.  Her one year old child has elevated lead levels.  She denies HA, visual changes or dizziness.   Contractions: Not present. Vag. Bleeding: None.  Movement: Present. Denies leaking of fluid.   The following portions of the patient's history were reviewed and updated as appropriate: allergies, current medications, past family history, past medical history, past social history, past surgical history and problem list. Problem list updated.  Objective:   Vitals:   06/22/16 0954  BP: 110/68  Pulse: 91  Weight: 139 lb 8 oz (63.3 kg)    Fetal Status: Fetal Heart Rate (bpm): 150   Movement: Present     General:  Alert, oriented and cooperative. Patient is in no acute distress.  Skin: Skin is warm and dry. No rash noted.   Cardiovascular: Normal heart rate noted  Respiratory: Normal respiratory effort, no problems with respiration noted  Abdomen: Soft, gravid, appropriate for gestational age. Pain/Pressure: Absent     Pelvic:  Cervical exam deferred        Extremities: Normal range of motion.  Edema: None  Mental Status: Normal mood and affect. Normal behavior. Normal judgment and thought content.   Urinalysis:      Assessment and Plan:  Pregnancy: [id number] at 8353w2d  1. Encounter for supervision of normal pregnancy, antepartum, unspecified gravidity  2. Lead Exposure Rx: - Lead, blood (adult age 25 yrs or greater) - CBC - Comprehensive metabolic panel  3. 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 Please refer to After Visit Summary for other counseling recommendations.  Return in about 4 weeks (around 07/20/2016) for ROB.   Brock BadHarper, Waylon Hershey A, MDPatient ID: Kathleen Chapman, female   DOB: 11/02/1990, 26 y.o.   MRN: [id number]

## 2016-06-22 NOTE — Progress Notes (Signed)
ROB reqs blood work to check Lead levels. Daughter's Pediatrician diagnosed child with elevated Lead levels per pt. Pt also c/o L ear pain and clogged since last Friday after being hit by ball.

## 2016-06-23 LAB — COMPREHENSIVE METABOLIC PANEL
ALBUMIN: 3.3 g/dL — AB (ref 3.5–5.5)
ALK PHOS: 54 IU/L (ref 39–117)
ALT: 10 IU/L (ref 0–32)
AST: 8 IU/L (ref 0–40)
Albumin/Globulin Ratio: 1.4 (ref 1.2–2.2)
BUN / CREAT RATIO: 7 — AB (ref 9–23)
BUN: 4 mg/dL — AB (ref 6–20)
CHLORIDE: 105 mmol/L (ref 96–106)
CO2: 17 mmol/L — ABNORMAL LOW (ref 20–29)
Calcium: 8.6 mg/dL — ABNORMAL LOW (ref 8.7–10.2)
Creatinine, Ser: 0.6 mg/dL (ref 0.57–1.00)
GFR calc Af Amer: 148 mL/min/{1.73_m2} (ref >59)
GFR calc non Af Amer: 128 mL/min/{1.73_m2} (ref >59)
GLUCOSE: 80 mg/dL (ref 65–99)
Globulin, Total: 2.3 g/dL (ref 1.5–4.5)
Potassium: 4.3 mmol/L (ref 3.5–5.2)
Sodium: 137 mmol/L (ref 134–144)
Total Protein: 5.6 g/dL — ABNORMAL LOW (ref 6.0–8.5)

## 2016-06-23 LAB — CBC
HEMOGLOBIN: 11.6 g/dL (ref 11.1–15.9)
Hematocrit: 34.7 % (ref 34.0–46.6)
MCH: 31.5 pg (ref 26.6–33.0)
MCHC: 33.4 g/dL (ref 31.5–35.7)
MCV: 94 fL (ref 79–97)
PLATELETS: 219 10*3/uL (ref 150–379)
RBC: 3.68 x10E6/uL — ABNORMAL LOW (ref 3.77–5.28)
RDW: 14.4 % (ref 12.3–15.4)
WBC: 10.2 10*3/uL (ref 3.4–10.8)

## 2016-06-23 LAB — LEAD, BLOOD (ADULT >= 16 YRS): Lead-Whole Blood: NOT DETECTED ug/dL (ref 0–19)

## 2016-07-20 ENCOUNTER — Encounter: Payer: Self-pay | Admitting: Obstetrics

## 2016-07-20 ENCOUNTER — Ambulatory Visit (INDEPENDENT_AMBULATORY_CARE_PROVIDER_SITE_OTHER): Payer: Medicaid Other | Admitting: Obstetrics

## 2016-07-20 VITALS — BP 118/69 | HR 101 | Wt 147.6 lb

## 2016-07-20 DIAGNOSIS — Z3490 Encounter for supervision of normal pregnancy, unspecified, unspecified trimester: Secondary | ICD-10-CM

## 2016-07-20 DIAGNOSIS — Z3482 Encounter for supervision of other normal pregnancy, second trimester: Secondary | ICD-10-CM

## 2016-07-20 DIAGNOSIS — B9689 Other specified bacterial agents as the cause of diseases classified elsewhere: Secondary | ICD-10-CM

## 2016-07-20 DIAGNOSIS — O26892 Other specified pregnancy related conditions, second trimester: Secondary | ICD-10-CM

## 2016-07-20 DIAGNOSIS — O26899 Other specified pregnancy related conditions, unspecified trimester: Secondary | ICD-10-CM

## 2016-07-20 DIAGNOSIS — N760 Acute vaginitis: Secondary | ICD-10-CM

## 2016-07-20 DIAGNOSIS — R12 Heartburn: Secondary | ICD-10-CM

## 2016-07-20 MED ORDER — OMEPRAZOLE 20 MG PO CPDR: 20.0000 mg | DELAYED_RELEASE_CAPSULE | Freq: Two times a day (BID) | ORAL | 5 refills | Status: DC

## 2016-07-20 MED ORDER — PRENATE MINI 29-0.6-0.4-350 MG PO CAPS: 1.0000 | ORAL_CAPSULE | Freq: Every day | ORAL | 4 refills | Status: AC

## 2016-07-20 MED ORDER — METRONIDAZOLE 0.75 % VA GEL: 1.0000 | Freq: Two times a day (BID) | VAGINAL | 0 refills | Status: DC

## 2016-07-20 NOTE — Progress Notes (Signed)
Subjective:  Kathleen HerrlichRodricka Chapman is a 26 y.o. [id number] at 5177w2d being seen today for ongoing prenatal care.  She is currently monitored for the following issues for this low-risk pregnancy and has Active labor at term; NVD (normal vaginal delivery); and Encounter for supervision of normal pregnancy, unspecified, unspecified trimester on her problem list.  Patient reports heartburn and vaginal irritation.  Contractions: Not present. Vag. Bleeding: None.  Movement: Present. Denies leaking of fluid.   The following portions of the patient's history were reviewed and updated as appropriate: allergies, current medications, past family history, past medical history, past social history, past surgical history and problem list. Problem list updated.  Objective:   Vitals:   07/20/16 1349  BP: 118/69  Pulse: (!) 101  Weight: 147 lb 9.6 oz (67 kg)    Fetal Status: Fetal Heart Rate (bpm): 150   Movement: Present     General:  Alert, oriented and cooperative. Patient is in no acute distress.  Skin: Skin is warm and dry. No rash noted.   Cardiovascular: Normal heart rate noted  Respiratory: Normal respiratory effort, no problems with respiration noted  Abdomen: Soft, gravid, appropriate for gestational age. Pain/Pressure: Absent     Pelvic:  Cervical exam deferred        Extremities: Normal range of motion.  Edema: None  Mental Status: Normal mood and affect. Normal behavior. Normal judgment and thought content.   Urinalysis:      Assessment and Plan:  Pregnancy: [id number] at 2477w2d  1. Encounter for supervision of normal pregnancy, antepartum, unspecified gravidity Rx: - Prenat w/o A-FeCbn-Meth-FA-DHA (PRENATE MINI) 29-0.6-0.4-350 MG CAPS; Take 1 capsule by mouth daily before breakfast.  Dispense: 90 capsule; Refill: 4  2. BV (bacterial vaginosis) Rx: - metroNIDAZOLE (METROGEL VAGINAL) 0.75 % vaginal gel; Place 1 Applicatorful vaginally 2 (two) times daily.  Dispense: 70 g; Refill: 0  3.  Heartburn during pregnancy, antepartum Rx: - omeprazole (PRILOSEC) 20 MG capsule; Take 1 capsule (20 mg total) by mouth 2 (two) times daily before a meal.  Dispense: 60 capsule; Refill: 5  Preterm labor symptoms and general obstetric precautions including but not limited to vaginal bleeding, contractions, leaking of fluid and fetal movement were reviewed in detail with the patient. Please refer to After Visit Summary for other counseling recommendations.  Return in about 4 weeks (around 08/17/2016) for ROB.and 2 hour GTT.   Brock BadHarper, Kaimana Neuzil A, MDPatient ID: Kathleen Chapman, female   DOB: [birth year], 26 y.o.   MRN: [id number]

## 2016-07-20 NOTE — Progress Notes (Signed)
Patient reports good fetal movement, denies pain. 

## 2016-08-17 ENCOUNTER — Encounter: Payer: Medicaid Other | Admitting: Obstetrics

## 2016-08-17 ENCOUNTER — Other Ambulatory Visit: Payer: Medicaid Other

## 2016-08-30 ENCOUNTER — Other Ambulatory Visit: Payer: Medicaid Other

## 2016-08-30 ENCOUNTER — Encounter: Payer: Self-pay | Admitting: Obstetrics

## 2016-08-30 ENCOUNTER — Encounter: Payer: Medicaid Other | Admitting: Obstetrics

## 2016-08-30 ENCOUNTER — Ambulatory Visit (INDEPENDENT_AMBULATORY_CARE_PROVIDER_SITE_OTHER): Payer: Medicaid Other | Admitting: Obstetrics

## 2016-08-30 ENCOUNTER — Other Ambulatory Visit (HOSPITAL_COMMUNITY)
Admission: RE | Admit: 2016-08-30 | Discharge: 2016-08-30 | Disposition: A | Discharge status: Home / Self Care | Payer: Medicaid Other | Source: Ambulatory Visit | Attending: Obstetrics | Admitting: Obstetrics

## 2016-08-30 VITALS — BP 121/66 | HR 106 | Wt 157.8 lb

## 2016-08-30 DIAGNOSIS — O4703 False labor before 37 completed weeks of gestation, third trimester: Secondary | ICD-10-CM | POA: Insufficient documentation

## 2016-08-30 DIAGNOSIS — Z3A30 30 weeks gestation of pregnancy: Secondary | ICD-10-CM | POA: Insufficient documentation

## 2016-08-30 DIAGNOSIS — Z3483 Encounter for supervision of other normal pregnancy, third trimester: Secondary | ICD-10-CM

## 2016-08-30 DIAGNOSIS — Z3480 Encounter for supervision of other normal pregnancy, unspecified trimester: Secondary | ICD-10-CM

## 2016-08-30 NOTE — Progress Notes (Signed)
Subjective:  Kathleen Chapman is a 26 y.o. [id number] at [redacted]w[redacted]d being seen today for ongoing prenatal care.  She is currently monitored for the following issues for this low-risk pregnancy and has Active labor at term; NVD (normal vaginal delivery); and Encounter for supervision of normal pregnancy, unspecified, unspecified trimester on her problem list.  Patient reports occasional contractions.  Contractions: Irregular. Vag. Bleeding: None.  Movement: Present. Denies leaking of fluid.   The following portions of the patient's history were reviewed and updated as appropriate: allergies, current medications, past family history, past medical history, past social history, past surgical history and problem list. Problem list updated.  Objective:   Vitals:   08/30/16 0834  BP: 121/66  Pulse: (!) 106  Weight: 157 lb 12.8 oz (71.6 kg)    Fetal Status:     Movement: Present     General:  Alert, oriented and cooperative. Patient is in no acute distress.  Skin: Skin is warm and dry. No rash noted.   Cardiovascular: Normal heart rate noted  Respiratory: Normal respiratory effort, no problems with respiration noted  Abdomen: Soft, gravid, appropriate for gestational age. Pain/Pressure: Present     Pelvic:  Cvx:  Long / Closed / -3 / Vtx  Extremities: Normal range of motion.  Edema: None  Mental Status: Normal mood and affect. Normal behavior. Normal judgment and thought content.   Urinalysis:      Assessment and Plan:  Pregnancy: [id number] at [redacted]w[redacted]d  1. Supervision of other normal pregnancy, antepartum Rx: - Glucose Tolerance, 2 Hours w/1 Hour - CBC - HIV antibody - RPR  2. Threatened premature labor in third trimester - discontinue intercourse   Preterm labor symptoms and general obstetric precautions including but not limited to vaginal bleeding, contractions, leaking of fluid and fetal movement were reviewed in detail with the patient. Please refer to After Visit Summary for other  counseling recommendations.  Return in about 2 weeks (around 09/13/2016) for ROB.   Brock Bad, MD

## 2016-08-30 NOTE — Addendum Note (Signed)
Addended by: Elby Beck F on: 08/30/2016 03:41 PM   Modules accepted: Orders

## 2016-08-30 NOTE — Progress Notes (Signed)
Patient reports she lost some of her mucus plug- she is concerned that she may be dilating. Patient reports she has started having some Deberah Pelton.

## 2016-08-31 ENCOUNTER — Other Ambulatory Visit: Payer: Self-pay | Admitting: Obstetrics

## 2016-08-31 DIAGNOSIS — O99019 Anemia complicating pregnancy, unspecified trimester: Secondary | ICD-10-CM

## 2016-08-31 LAB — GLUCOSE TOLERANCE, 2 HOURS W/ 1HR
Glucose, 1 hour: 135 mg/dL (ref 65–179)
Glucose, 2 hour: 115 mg/dL (ref 65–152)
Glucose, Fasting: 72 mg/dL (ref 65–91)

## 2016-08-31 LAB — CBC
HEMATOCRIT: 30.2 % — AB (ref 34.0–46.6)
HEMOGLOBIN: 9.9 g/dL — AB (ref 11.1–15.9)
MCH: 29.7 pg (ref 26.6–33.0)
MCHC: 32.8 g/dL (ref 31.5–35.7)
MCV: 91 fL (ref 79–97)
Platelets: 194 10*3/uL (ref 150–379)
RBC: 3.33 x10E6/uL — AB (ref 3.77–5.28)
RDW: 13.7 % (ref 12.3–15.4)
WBC: 11 10*3/uL — ABNORMAL HIGH (ref 3.4–10.8)

## 2016-08-31 LAB — CERVICOVAGINAL ANCILLARY ONLY
BACTERIAL VAGINITIS: NEGATIVE
CANDIDA VAGINITIS: NEGATIVE
CHLAMYDIA, DNA PROBE: NEGATIVE
Neisseria Gonorrhea: NEGATIVE
TRICH (WINDOWPATH): NEGATIVE

## 2016-08-31 LAB — HIV ANTIBODY (ROUTINE TESTING W REFLEX): HIV Screen 4th Generation wRfx: NONREACTIVE

## 2016-08-31 LAB — RPR: RPR Ser Ql: NONREACTIVE

## 2016-08-31 MED ORDER — FERROUS SULFATE 325 (65 FE) MG PO TABS: 325.0000 mg | ORAL_TABLET | Freq: Two times a day (BID) | ORAL | 5 refills | Status: DC

## 2016-09-01 ENCOUNTER — Telehealth: Payer: Self-pay

## 2016-09-01 NOTE — Telephone Encounter (Signed)
S/w pt and advised of results and rx sent. 

## 2016-09-13 ENCOUNTER — Encounter: Payer: Medicaid Other | Admitting: Obstetrics

## 2016-10-04 ENCOUNTER — Ambulatory Visit (INDEPENDENT_AMBULATORY_CARE_PROVIDER_SITE_OTHER): Payer: Medicaid Other | Admitting: Obstetrics and Gynecology

## 2016-10-04 ENCOUNTER — Other Ambulatory Visit (HOSPITAL_COMMUNITY)
Admission: RE | Admit: 2016-10-04 | Discharge: 2016-10-04 | Disposition: A | Discharge status: Home / Self Care | Payer: Medicaid Other | Source: Ambulatory Visit | Attending: Obstetrics and Gynecology | Admitting: Obstetrics and Gynecology

## 2016-10-04 VITALS — BP 115/72 | HR 101 | Temp 97.1°F | Wt 161.0 lb

## 2016-10-04 DIAGNOSIS — Z3480 Encounter for supervision of other normal pregnancy, unspecified trimester: Secondary | ICD-10-CM

## 2016-10-04 DIAGNOSIS — O26893 Other specified pregnancy related conditions, third trimester: Secondary | ICD-10-CM | POA: Insufficient documentation

## 2016-10-04 DIAGNOSIS — N898 Other specified noninflammatory disorders of vagina: Secondary | ICD-10-CM | POA: Insufficient documentation

## 2016-10-04 DIAGNOSIS — O26899 Other specified pregnancy related conditions, unspecified trimester: Secondary | ICD-10-CM

## 2016-10-04 DIAGNOSIS — Z3483 Encounter for supervision of other normal pregnancy, third trimester: Secondary | ICD-10-CM

## 2016-10-04 DIAGNOSIS — Z3A35 35 weeks gestation of pregnancy: Secondary | ICD-10-CM | POA: Diagnosis not present

## 2016-10-04 NOTE — Progress Notes (Signed)
Subjective:  Kathleen Chapman is a 26 y.o. [id number] at [redacted]w[redacted]d being seen today for ongoing prenatal care.  She is currently monitored for the following issues for this low-risk pregnancy and has Supervision of other normal pregnancy, antepartum and Vaginal discharge during pregnancy on her problem list.  Patient reports vaginal discharge and pressure, occ ctx. no bleeding or LOF.  Contractions: Irregular. Vag. Bleeding: None.  Movement: Present. Denies leaking of fluid.   The following portions of the patient's history were reviewed and updated as appropriate: allergies, current medications, past family history, past medical history, past social history, past surgical history and problem list. Problem list updated.  Objective:   Vitals:   10/04/16 1058  BP: 115/72  Pulse: (!) 101  Temp: (!) 97.1 F (36.2 C)  Weight: 161 lb (73 kg)    Fetal Status:     Movement: Present     General:  Alert, oriented and cooperative. Patient is in no acute distress.  Skin: Skin is warm and dry. No rash noted.   Cardiovascular: Normal heart rate noted  Respiratory: Normal respiratory effort, no problems with respiration noted  Abdomen: Soft, gravid, appropriate for gestational age. Pain/Pressure: Present     Pelvic:  Cervical exam performed        Extremities: Normal range of motion.  Edema: Trace  Mental Status: Normal mood and affect. Normal behavior. Normal judgment and thought content.   Urinalysis:      Assessment and Plan:  Pregnancy: [id number] at [redacted]w[redacted]d  1. Supervision of other normal pregnancy, antepartum Stable Cultures and GBS today Labor precautions Declined flu vaccine  2. Vaginal discharge during pregnancy in third trimester  - Cervicovaginal ancillary only - Strep Gp B NAA  Preterm labor symptoms and general obstetric precautions including but not limited to vaginal bleeding, contractions, leaking of fluid and fetal movement were reviewed in detail with the patient. Please refer to  After Visit Summary for other counseling recommendations.  Return in about 1 week (around 10/11/2016) for OB visit.   Hermina Staggers, MD

## 2016-10-04 NOTE — Progress Notes (Signed)
Pt c/o rectal and vaginal pain/pressure. She is requesting cervical check today.

## 2016-10-04 NOTE — Patient Instructions (Signed)
Third Trimester of Pregnancy The third trimester is from week 28 through week 40 (months 7 through 9). The third trimester is a time when the unborn baby (fetus) is growing rapidly. At the end of the ninth month, the fetus is about 20 inches in length and weighs 6-10 pounds. Body changes during your third trimester Your body will continue to go through many changes during pregnancy. The changes vary from woman to woman. During the third trimester:  Your weight will continue to increase. You can expect to gain 25-35 pounds (11-16 kg) by the end of the pregnancy.  You may begin to get stretch marks on your hips, abdomen, and breasts.  You may urinate more often because the fetus is moving lower into your pelvis and pressing on your bladder.  You may develop or continue to have heartburn. This is caused by increased hormones that slow down muscles in the digestive tract.  You may develop or continue to have constipation because increased hormones slow digestion and cause the muscles that push waste through your intestines to relax.  You may develop hemorrhoids. These are swollen veins (varicose veins) in the rectum that can itch or be painful.  You may develop swollen, bulging veins (varicose veins) in your legs.  You may have increased body aches in the pelvis, back, or thighs. This is due to weight gain and increased hormones that are relaxing your joints.  You may have changes in your hair. These can include thickening of your hair, rapid growth, and changes in texture. Some women also have hair loss during or after pregnancy, or hair that feels dry or thin. Your hair will most likely return to normal after your baby is born.  Your breasts will continue to grow and they will continue to become tender. A yellow fluid (colostrum) may leak from your breasts. This is the first milk you are producing for your baby.  Your belly button may stick out.  You may notice more swelling in your hands,  face, or ankles.  You may have increased tingling or numbness in your hands, arms, and legs. The skin on your belly may also feel numb.  You may feel short of breath because of your expanding uterus.  You may have more problems sleeping. This can be caused by the size of your belly, increased need to urinate, and an increase in your body's metabolism.  You may notice the fetus "dropping," or moving lower in your abdomen (lightening).  You may have increased vaginal discharge.  You may notice your joints feel loose and you may have pain around your pelvic bone.  What to expect at prenatal visits You will have prenatal exams every 2 weeks until week 36. Then you will have weekly prenatal exams. During a routine prenatal visit:  You will be weighed to make sure you and the baby are growing normally.  Your blood pressure will be taken.  Your abdomen will be measured to track your baby's growth.  The fetal heartbeat will be listened to.  Any test results from the previous visit will be discussed.  You may have a cervical check near your due date to see if your cervix has softened or thinned (effaced).  You will be tested for Group B streptococcus. This happens between 35 and 37 weeks.  Your health care provider may ask you:  What your birth plan is.  How you are feeling.  If you are feeling the baby move.  If you have had   any abnormal symptoms, such as leaking fluid, bleeding, severe headaches, or abdominal cramping.  If you are using any tobacco products, including cigarettes, chewing tobacco, and electronic cigarettes.  If you have any questions.  Other tests or screenings that may be performed during your third trimester include:  Blood tests that check for low iron levels (anemia).  Fetal testing to check the health, activity level, and growth of the fetus. Testing is done if you have certain medical conditions or if there are problems during the  pregnancy.  Nonstress test (NST). This test checks the health of your baby to make sure there are no signs of problems, such as the baby not getting enough oxygen. During this test, a belt is placed around your belly. The baby is made to move, and its heart rate is monitored during movement.  What is false labor? False labor is a condition in which you feel small, irregular tightenings of the muscles in the womb (contractions) that usually go away with rest, changing position, or drinking water. These are called Braxton Hicks contractions. Contractions may last for hours, days, or even weeks before true labor sets in. If contractions come at regular intervals, become more frequent, increase in intensity, or become painful, you should see your health care provider. What are the signs of labor?  Abdominal cramps.  Regular contractions that start at 10 minutes apart and become stronger and more frequent with time.  Contractions that start on the top of the uterus and spread down to the lower abdomen and back.  Increased pelvic pressure and dull back pain.  A watery or bloody mucus discharge that comes from the vagina.  Leaking of amniotic fluid. This is also known as your "water breaking." It could be a slow trickle or a gush. Let your health care provider know if it has a color or strange odor. If you have any of these signs, call your health care provider right away, even if it is before your due date. Follow these instructions at home: Medicines  Follow your health care provider's instructions regarding medicine use. Specific medicines may be either safe or unsafe to take during pregnancy.  Take a prenatal vitamin that contains at least 600 micrograms (mcg) of folic acid.  If you develop constipation, try taking a stool softener if your health care provider approves. Eating and drinking  Eat a balanced diet that includes fresh fruits and vegetables, whole grains, good sources of protein  such as meat, eggs, or tofu, and low-fat dairy. Your health care provider will help you determine the amount of weight gain that is right for you.  Avoid raw meat and uncooked cheese. These carry germs that can cause birth defects in the baby.  If you have low calcium intake from food, talk to your health care provider about whether you should take a daily calcium supplement.  Eat four or five small meals rather than three large meals a day.  Limit foods that are high in fat and processed sugars, such as fried and sweet foods.  To prevent constipation: ? Drink enough fluid to keep your urine clear or pale yellow. ? Eat foods that are high in fiber, such as fresh fruits and vegetables, whole grains, and beans. Activity  Exercise only as directed by your health care provider. Most women can continue their usual exercise routine during pregnancy. Try to exercise for 30 minutes at least 5 days a week. Stop exercising if you experience uterine contractions.  Avoid heavy   lifting.  Do not exercise in extreme heat or humidity, or at high altitudes.  Wear low-heel, comfortable shoes.  Practice good posture.  You may continue to have sex unless your health care provider tells you otherwise. Relieving pain and discomfort  Take frequent breaks and rest with your legs elevated if you have leg cramps or low back pain.  Take warm sitz baths to soothe any pain or discomfort caused by hemorrhoids. Use hemorrhoid cream if your health care provider approves.  Wear a good support bra to prevent discomfort from breast tenderness.  If you develop varicose veins: ? Wear support pantyhose or compression stockings as told by your healthcare provider. ? Elevate your feet for 15 minutes, 3-4 times a day. Prenatal care  Write down your questions. Take them to your prenatal visits.  Keep all your prenatal visits as told by your health care provider. This is important. Safety  Wear your seat belt at  all times when driving.  Make a list of emergency phone numbers, including numbers for family, friends, the hospital, and police and fire departments. General instructions  Avoid cat litter boxes and soil used by cats. These carry germs that can cause birth defects in the baby. If you have a cat, ask someone to clean the litter box for you.  Do not travel far distances unless it is absolutely necessary and only with the approval of your health care provider.  Do not use hot tubs, steam rooms, or saunas.  Do not drink alcohol.  Do not use any products that contain nicotine or tobacco, such as cigarettes and e-cigarettes. If you need help quitting, ask your health care provider.  Do not use any medicinal herbs or unprescribed drugs. These chemicals affect the formation and growth of the baby.  Do not douche or use tampons or scented sanitary pads.  Do not cross your legs for long periods of time.  To prepare for the arrival of your baby: ? Take prenatal classes to understand, practice, and ask questions about labor and delivery. ? Make a trial run to the hospital. ? Visit the hospital and tour the maternity area. ? Arrange for maternity or paternity leave through employers. ? Arrange for family and friends to take care of pets while you are in the hospital. ? Purchase a rear-facing car seat and make sure you know how to install it in your car. ? Pack your hospital bag. ? Prepare the baby's nursery. Make sure to remove all pillows and stuffed animals from the baby's crib to prevent suffocation.  Visit your dentist if you have not gone during your pregnancy. Use a soft toothbrush to brush your teeth and be gentle when you floss. Contact a health care provider if:  You are unsure if you are in labor or if your water has broken.  You become dizzy.  You have mild pelvic cramps, pelvic pressure, or nagging pain in your abdominal area.  You have lower back pain.  You have persistent  nausea, vomiting, or diarrhea.  You have an unusual or bad smelling vaginal discharge.  You have pain when you urinate. Get help right away if:  Your water breaks before 37 weeks.  You have regular contractions less than 5 minutes apart before 37 weeks.  You have a fever.  You are leaking fluid from your vagina.  You have spotting or bleeding from your vagina.  You have severe abdominal pain or cramping.  You have rapid weight loss or weight gain.    You have shortness of breath with chest pain.  You notice sudden or extreme swelling of your face, hands, ankles, feet, or legs.  Your baby makes fewer than 10 movements in 2 hours.  You have severe headaches that do not go away when you take medicine.  You have vision changes. Summary  The third trimester is from week 28 through week 40, months 7 through 9. The third trimester is a time when the unborn baby (fetus) is growing rapidly.  During the third trimester, your discomfort may increase as you and your baby continue to gain weight. You may have abdominal, leg, and back pain, sleeping problems, and an increased need to urinate.  During the third trimester your breasts will keep growing and they will continue to become tender. A yellow fluid (colostrum) may leak from your breasts. This is the first milk you are producing for your baby.  False labor is a condition in which you feel small, irregular tightenings of the muscles in the womb (contractions) that eventually go away. These are called Braxton Hicks contractions. Contractions may last for hours, days, or even weeks before true labor sets in.  Signs of labor can include: abdominal cramps; regular contractions that start at 10 minutes apart and become stronger and more frequent with time; watery or bloody mucus discharge that comes from the vagina; increased pelvic pressure and dull back pain; and leaking of amniotic fluid. This information is not intended to replace advice  given to you by your health care provider. Make sure you discuss any questions you have with your health care provider. Document Released: 12/15/2000 Document Revised: 05/29/2015 Document Reviewed: 02/22/2012 Elsevier Interactive Patient Education  2017 [employer]  

## 2016-10-05 LAB — OB RESULTS CONSOLE GC/CHLAMYDIA
Chlamydia: NEGATIVE
Gonorrhea: NEGATIVE

## 2016-10-05 LAB — OB RESULTS CONSOLE GBS: GBS: NEGATIVE

## 2016-10-05 LAB — CERVICOVAGINAL ANCILLARY ONLY
Bacterial vaginitis: NEGATIVE
CHLAMYDIA, DNA PROBE: NEGATIVE
Candida vaginitis: NEGATIVE
Neisseria Gonorrhea: NEGATIVE
TRICH (WINDOWPATH): NEGATIVE

## 2016-10-06 LAB — STREP GP B NAA: Strep Gp B NAA: NEGATIVE

## 2016-10-15 ENCOUNTER — Encounter: Payer: Medicaid Other | Admitting: Obstetrics

## 2016-10-25 ENCOUNTER — Encounter: Payer: Self-pay | Admitting: Obstetrics

## 2016-10-25 ENCOUNTER — Ambulatory Visit (INDEPENDENT_AMBULATORY_CARE_PROVIDER_SITE_OTHER): Payer: Medicaid Other | Admitting: Obstetrics

## 2016-10-25 DIAGNOSIS — Z3480 Encounter for supervision of other normal pregnancy, unspecified trimester: Secondary | ICD-10-CM

## 2016-10-25 DIAGNOSIS — Z3483 Encounter for supervision of other normal pregnancy, third trimester: Secondary | ICD-10-CM

## 2016-10-25 NOTE — Progress Notes (Signed)
Subjective:  Kathleen Chapman is a 26 y.o. [id number] at 7477w1d being seen today for ongoing prenatal care.  She is currently monitored for the following issues for this low-risk pregnancy and has Supervision of other normal pregnancy, antepartum and Vaginal discharge during pregnancy on her problem list.  Patient reports occasional contractions.  Contractions: Irregular. Vag. Bleeding: None.  Movement: Present. Denies leaking of fluid.   The following portions of the patient's history were reviewed and updated as appropriate: allergies, current medications, past family history, past medical history, past social history, past surgical history and problem list. Problem list updated.  Objective:   Vitals:   10/25/16 0935  BP: 119/77  Pulse: 90  Weight: 169 lb 3.2 oz (76.7 kg)    Fetal Status: Fetal Heart Rate (bpm): 140   Movement: Present     General:  Alert, oriented and cooperative. Patient is in no acute distress.  Skin: Skin is warm and dry. No rash noted.   Cardiovascular: Normal heart rate noted  Respiratory: Normal respiratory effort, no problems with respiration noted  Abdomen: Soft, gravid, appropriate for gestational age. Pain/Pressure: Present     Pelvic:  Cvx:  2cm / 50% / -2 / Vtx  Extremities: Normal range of motion.  Edema: None  Mental Status: Normal mood and affect. Normal behavior. Normal judgment and thought content.   Urinalysis:      Assessment and Plan:  Pregnancy: [id number] at 5977w1d  1. Supervision of other normal pregnancy, antepartum   Term labor symptoms and general obstetric precautions including but not limited to vaginal bleeding, contractions, leaking of fluid and fetal movement were reviewed in detail with the patient. Please refer to After Visit Summary for other counseling recommendations.  Return in about 1 week (around 11/01/2016) for ROB.   Brock BadHarper, Charles A, MD

## 2016-10-25 NOTE — Progress Notes (Signed)
Pt c/o vaginal and rectal pressure.  Pt requesting cervical check today.

## 2016-10-28 ENCOUNTER — Encounter (HOSPITAL_COMMUNITY): Payer: Self-pay | Admitting: *Deleted

## 2016-10-28 ENCOUNTER — Inpatient Hospital Stay (HOSPITAL_COMMUNITY)
Admission: AD | Admit: 2016-10-28 | Discharge: 2016-10-29 | DRG: 807 | Disposition: A | Discharge status: Home / Self Care | Payer: Medicaid Other | Source: Ambulatory Visit | Attending: Obstetrics and Gynecology | Admitting: Obstetrics and Gynecology

## 2016-10-28 DIAGNOSIS — Z3A38 38 weeks gestation of pregnancy: Secondary | ICD-10-CM

## 2016-10-28 DIAGNOSIS — Z3A39 39 weeks gestation of pregnancy: Secondary | ICD-10-CM

## 2016-10-28 DIAGNOSIS — Z3490 Encounter for supervision of normal pregnancy, unspecified, unspecified trimester: Secondary | ICD-10-CM

## 2016-10-28 DIAGNOSIS — O26893 Other specified pregnancy related conditions, third trimester: Secondary | ICD-10-CM | POA: Diagnosis present

## 2016-10-28 DIAGNOSIS — Z87891 Personal history of nicotine dependence: Secondary | ICD-10-CM

## 2016-10-28 LAB — CBC
HCT: 32.7 % — ABNORMAL LOW (ref 36.0–46.0)
Hemoglobin: 10.7 g/dL — ABNORMAL LOW (ref 12.0–15.0)
MCH: 27.4 pg (ref 26.0–34.0)
MCHC: 32.7 g/dL (ref 30.0–36.0)
MCV: 83.8 fL (ref 78.0–100.0)
PLATELETS: 197 10*3/uL (ref 150–400)
RBC: 3.9 MIL/uL (ref 3.87–5.11)
RDW: 14.4 % (ref 11.5–15.5)
WBC: 11.3 10*3/uL — AB (ref 4.0–10.5)

## 2016-10-28 LAB — TYPE AND SCREEN
ABO/RH(D): AB POS
ANTIBODY SCREEN: NEGATIVE

## 2016-10-28 MED ORDER — SENNOSIDES-DOCUSATE SODIUM 8.6-50 MG PO TABS
2.0000 | ORAL_TABLET | ORAL | Status: DC
  Administered 2016-10-28: 2 via ORAL
  Filled 2016-10-28: qty 2

## 2016-10-28 MED ORDER — ACETAMINOPHEN 325 MG PO TABS: 650.0000 mg | ORAL_TABLET | ORAL | Status: DC | PRN

## 2016-10-28 MED ORDER — OXYTOCIN 40 UNITS IN LACTATED RINGERS INFUSION - SIMPLE MED: 2.5000 [IU]/h | INTRAVENOUS | Status: DC

## 2016-10-28 MED ORDER — DIPHENHYDRAMINE HCL 25 MG PO CAPS: 25.0000 mg | ORAL_CAPSULE | Freq: Four times a day (QID) | ORAL | Status: DC | PRN

## 2016-10-28 MED ORDER — COCONUT OIL OIL: 1.0000 "application " | TOPICAL_OIL | Status: DC | PRN

## 2016-10-28 MED ORDER — SIMETHICONE 80 MG PO CHEW: 80.0000 mg | CHEWABLE_TABLET | ORAL | Status: DC | PRN

## 2016-10-28 MED ORDER — LACTATED RINGERS IV SOLN: INTRAVENOUS | Status: DC

## 2016-10-28 MED ORDER — ZOLPIDEM TARTRATE 5 MG PO TABS: 5.0000 mg | ORAL_TABLET | Freq: Every evening | ORAL | Status: DC | PRN

## 2016-10-28 MED ORDER — ACETAMINOPHEN 325 MG PO TABS
650.0000 mg | ORAL_TABLET | ORAL | Status: DC | PRN
  Administered 2016-10-28: 650 mg via ORAL
  Filled 2016-10-28: qty 2

## 2016-10-28 MED ORDER — DIBUCAINE 1 % RE OINT: 1.0000 "application " | TOPICAL_OINTMENT | RECTAL | Status: DC | PRN

## 2016-10-28 MED ORDER — IBUPROFEN 600 MG PO TABS
600.0000 mg | ORAL_TABLET | Freq: Four times a day (QID) | ORAL | Status: DC
  Administered 2016-10-28 – 2016-10-29 (×5): 600 mg via ORAL
  Filled 2016-10-28 (×5): qty 1

## 2016-10-28 MED ORDER — TETANUS-DIPHTH-ACELL PERTUSSIS 5-2.5-18.5 LF-MCG/0.5 IM SUSP: 0.5000 mL | Freq: Once | INTRAMUSCULAR | Status: DC

## 2016-10-28 MED ORDER — ONDANSETRON HCL 4 MG/2ML IJ SOLN: 4.0000 mg | INTRAMUSCULAR | Status: DC | PRN

## 2016-10-28 MED ORDER — BENZOCAINE-MENTHOL 20-0.5 % EX AERO: 1.0000 "application " | INHALATION_SPRAY | CUTANEOUS | Status: DC | PRN

## 2016-10-28 MED ORDER — OXYTOCIN BOLUS FROM INFUSION: 500.0000 mL | Freq: Once | INTRAVENOUS | Status: DC

## 2016-10-28 MED ORDER — ONDANSETRON HCL 4 MG/2ML IJ SOLN: 4.0000 mg | Freq: Four times a day (QID) | INTRAMUSCULAR | Status: DC | PRN

## 2016-10-28 MED ORDER — FERROUS SULFATE 325 (65 FE) MG PO TABS
325.0000 mg | ORAL_TABLET | Freq: Two times a day (BID) | ORAL | Status: DC
  Administered 2016-10-28 – 2016-10-29 (×2): 325 mg via ORAL
  Filled 2016-10-28 (×2): qty 1

## 2016-10-28 MED ORDER — LIDOCAINE HCL (PF) 1 % IJ SOLN
INTRAMUSCULAR | Status: AC
  Filled 2016-10-28: qty 30

## 2016-10-28 MED ORDER — WITCH HAZEL-GLYCERIN EX PADS: 1.0000 "application " | MEDICATED_PAD | CUTANEOUS | Status: DC | PRN

## 2016-10-28 MED ORDER — LIDOCAINE HCL (PF) 1 % IJ SOLN
30.0000 mL | INTRAMUSCULAR | Status: DC | PRN
  Filled 2016-10-28: qty 30

## 2016-10-28 MED ORDER — LACTATED RINGERS IV SOLN: 500.0000 mL | INTRAVENOUS | Status: DC | PRN

## 2016-10-28 MED ORDER — PRENATAL MULTIVITAMIN CH
1.0000 | ORAL_TABLET | Freq: Every day | ORAL | Status: DC
  Administered 2016-10-28 – 2016-10-29 (×2): 1 via ORAL
  Filled 2016-10-28 (×2): qty 1

## 2016-10-28 MED ORDER — ONDANSETRON HCL 4 MG PO TABS: 4.0000 mg | ORAL_TABLET | ORAL | Status: DC | PRN

## 2016-10-28 MED ORDER — SOD CITRATE-CITRIC ACID 500-334 MG/5ML PO SOLN: 30.0000 mL | ORAL | Status: DC | PRN

## 2016-10-28 MED ORDER — OXYTOCIN 10 UNIT/ML IJ SOLN
INTRAMUSCULAR | Status: AC
  Administered 2016-10-28: 10 [IU]
  Filled 2016-10-28: qty 1

## 2016-10-28 NOTE — H&P (Signed)
Kathleen Chapman is a 26 y.o. female presenting for active labor.She is a [id number] , at 3236w4d With hx of rapid labors. Last del was 2 pushes upon arrival to hospital, No IV. OB History    Gravida Para Term Preterm AB Living   4 2 2   1 2    SAB TAB Ectopic Multiple Live Births   1     0 2     History reviewed. No pertinent past medical history. History reviewed. No pertinent surgical history. Family History: family history includes Diabetes in her maternal grandfather, maternal grandmother, paternal grandfather, and paternal grandmother; Hypertension in her mother. Social History:  reports that she quit smoking about 2 years ago. Her smoking use included Cigarettes. She smoked 0.50 packs per day. She has never used smokeless tobacco. She reports that she does not drink alcohol or use drugs.     Maternal Diabetes: No Genetic Screening: Normal Maternal Ultrasounds/Referrals: Normal Fetal Ultrasounds or other Referrals:  None Maternal Substance Abuse:  No Significant Maternal Medications:  None Significant Maternal Lab Results:  Lab values include: Group B Strep negative Other Comments:  None  ROS History Dilation: 10 Effacement (%): 100 Station: 0 Exam by:: Dr. Emelda FearFerguson Blood pressure 139/76, pulse (!) 102, temperature 98 F (36.7 C), temperature source Oral, resp. rate 20, height 5' (1.524 m), weight 170 lb (77.1 kg), last menstrual period 02/01/2016, unknown if currently breastfeeding. Exam Physical Exam  Constitutional: She is oriented to person, place, and time. She appears well-developed and well-nourished.  HENT:  Head: Normocephalic and atraumatic.  Eyes: Pupils are equal, round, and reactive to light.  Neck: Normal range of motion.  Cardiovascular: Normal rate.   Respiratory: Effort normal.  GI: Soft.  Genitourinary: Vagina normal.  Genitourinary Comments: Gravid uterus , consistent with dates.  Neurological: She is alert and oriented to person, place, and time.   Skin: Skin is warm and dry.  Psychiatric: She has a normal mood and affect. Her behavior is normal. Judgment and thought content normal.    Prenatal labs: ABO, Rh: AB/Positive/-- (05/22 1040) Antibody: Negative (05/22 1040) Rubella: 1.36 (05/22 1040) RPR: Non Reactive (08/27 1030)  HBsAg: Negative (05/22 1040)  HIV:    GBS: Negative (10/02 0000)   Assessment/Plan: Pregnancy Active labor, transition phase labor Expect SVD.   Kathleen Chapman V 10/28/2016, 8:54 AM

## 2016-10-28 NOTE — Plan of Care (Signed)
Problem: Role Relationship: Goal: Ability to demonstrate positive interaction with newborn will improve Outcome: Completed/Met Date Met: 10/28/16 Patient is bonding well with newborn.

## 2016-10-28 NOTE — MAU Note (Signed)
Pt. In lobby snapping her fingers, "let's go, I have my babies quick".  Pt. Escorted to room #6, screaming with contractions, EFM applied - FHR 138, contractions palpated, mod., each contraction lasting 50-60 sec. Pt. For fetal movement, SVE BBW, 100/10/0.  Pt. Transferred to L&D for vaginal delivery[facility]

## 2016-10-29 LAB — CBC
HCT: 31.7 % — ABNORMAL LOW (ref 36.0–46.0)
Hemoglobin: 10.5 g/dL — ABNORMAL LOW (ref 12.0–15.0)
MCH: 27.3 pg (ref 26.0–34.0)
MCHC: 33.1 g/dL (ref 30.0–36.0)
MCV: 82.3 fL (ref 78.0–100.0)
PLATELETS: 211 10*3/uL (ref 150–400)
RBC: 3.85 MIL/uL — ABNORMAL LOW (ref 3.87–5.11)
RDW: 14.4 % (ref 11.5–15.5)
WBC: 14.2 10*3/uL — AB (ref 4.0–10.5)

## 2016-10-29 LAB — BIRTH TISSUE RECOVERY COLLECTION (PLACENTA DONATION)

## 2016-10-29 LAB — RPR: RPR Ser Ql: NONREACTIVE

## 2016-10-29 MED ORDER — IBUPROFEN 600 MG PO TABS: 600.0000 mg | ORAL_TABLET | Freq: Four times a day (QID) | ORAL | 0 refills | Status: DC

## 2016-10-29 MED ORDER — SENNOSIDES-DOCUSATE SODIUM 8.6-50 MG PO TABS: 2.0000 | ORAL_TABLET | Freq: Every evening | ORAL | 0 refills | Status: DC | PRN

## 2016-10-29 MED ORDER — FAMOTIDINE 20 MG PO TABS
20.0000 mg | ORAL_TABLET | Freq: Once | ORAL | Status: AC
  Administered 2016-10-29: 20 mg via ORAL
  Filled 2016-10-29: qty 1

## 2016-10-29 NOTE — Discharge Instructions (Signed)

## 2016-10-29 NOTE — Discharge Summary (Signed)
OB Discharge Summary     Patient Name: Kathleen Chapman DOB: Nov 11, 1990 MRN: [id number]  Date of admission: 10/28/2016 Delivering MD: Henderson NewcomerFAULK, STEPHANIE A   Date of discharge: 10/29/2016  Admitting diagnosis: 39WKS,LABOR Intrauterine pregnancy: 6471w4d     Secondary diagnosis:  Active Problems:   Term pregnancy delivered   Pregnancy   Vaginal delivery  Additional problems: None     Discharge diagnosis: Term Pregnancy Delivered                                                                                                Post partum procedures:None  Augmentation: none  Complications: None  Hospital course:  Onset of Labor With Vaginal Delivery     26 y.o. yo [phone] at 2171w4d was admitted in Active Labor on 10/28/2016. Patient had an uncomplicated labor course as follows:  Membrane Rupture Time/Date: 8:20 AM ,10/28/2016   Intrapartum Procedures: Episiotomy: None [1]                                         Lacerations:  None [1]  Patient had a delivery of a Viable infant. 10/28/2016  Information for the patient's newborn:  Kathleen Chapman, Girl Deniese [[id number]][[id number]]  Delivery Method: Vag-Spont    Pateint had an uncomplicated postpartum course.  She is ambulating, tolerating a regular diet, passing flatus, and urinating well. Patient is discharged home in stable condition on 10/29/16.   Physical exam  Vitals:   10/28/16 1837 10/28/16 2309 10/29/16 0519 10/29/16 0813  BP: 128/73 122/60 (!) 108/43 (!) 111/54  Pulse: 75 79 81 70  Resp: 18 18 17 20   Temp: 97.7 F (36.5 C) 98.1 F (36.7 C) 98.4 F (36.9 C) 98.4 F (36.9 C)  TempSrc: Oral Oral Oral Oral  SpO2:  100% 100%   Weight:      Height:       General: alert, cooperative and no distress Lochia: appropriate Uterine Fundus: firm Incision: N/A DVT Evaluation: No evidence of DVT seen on physical exam. Labs: Lab Results  Component Value Date   WBC 14.2 (H) 10/29/2016   HGB 10.5 (L) 10/29/2016   HCT 31.7 (L) 10/29/2016    MCV 82.3 10/29/2016   PLT 211 10/29/2016   CMP Latest Ref Rng & Units 06/22/2016  Glucose 65 - 99 mg/dL 80  BUN 6 - 20 mg/dL 4(L)  Creatinine 6.940.57 - 1.00 mg/dL 8.540.60  Sodium 627134 - 035144 mmol/L 137  Potassium 3.5 - 5.2 mmol/L 4.3  Chloride 96 - 106 mmol/L 105  CO2 20 - 29 mmol/L 17(L)  Calcium 8.7 - 10.2 mg/dL 0.0(X8.6(L)  Total Protein 6.0 - 8.5 g/dL 3.8(H5.6(L)  Total Bilirubin 0.0 - 1.2 mg/dL <8.2<0.2  Alkaline Phos 39 - 117 IU/L 54  AST 0 - 40 IU/L 8  ALT 0 - 32 IU/L 10    Discharge instruction: per After Visit Summary and "Baby and Me Booklet".  After visit meds:  Allergies as of 10/29/2016      Reactions  Hydrocodone Nausea And Vomiting      Medication List    STOP taking these medications   ferrous sulfate 325 (65 FE) MG tablet     TAKE these medications   acetaminophen 325 MG tablet Commonly known as:  TYLENOL Take 650 mg by mouth every 6 (six) hours as needed for mild pain or headache.   ibuprofen 600 MG tablet Commonly known as:  ADVIL,MOTRIN Take 1 tablet (600 mg total) by mouth every 6 (six) hours.   omeprazole 20 MG capsule Commonly known as:  PRILOSEC Take 1 capsule (20 mg total) by mouth 2 (two) times daily before a meal.   PRENATE MINI 29-0.6-0.4-350 MG Caps Take 1 capsule by mouth daily before breakfast.   senna-docusate 8.6-50 MG tablet Commonly known as:  Senokot-S Take 2 tablets by mouth at bedtime as needed for mild constipation.       Diet: routine diet  Activity: Advance as tolerated. Pelvic rest for 6 weeks.   Outpatient follow up:4 weeks Follow up Appt:Future Appointments Date Time Provider Department Center  11/02/2016 10:00 AM Brock Bad, MD [facility] None   Follow up Visit:No Follow-up on file.  Postpartum contraception: Nexplanon  Newborn Data: Live born female  Birth Weight: 7 lb 3.9 oz (3286 g) APGAR: 8, 9  Newborn Delivery   Birth date/time:  10/28/2016 08:34:00 Delivery type:  Vaginal, Spontaneous Delivery       Baby Feeding: Bottle and Breast Disposition:home with mother   10/29/2016 Kathleen Ada, DO

## 2016-11-02 ENCOUNTER — Encounter: Payer: Medicaid Other | Admitting: Obstetrics

## 2016-11-29 ENCOUNTER — Ambulatory Visit: Payer: Medicaid Other | Admitting: Obstetrics

## 2016-12-15 ENCOUNTER — Ambulatory Visit: Payer: Medicaid Other | Admitting: Obstetrics

## 2016-12-21 ENCOUNTER — Encounter: Payer: Self-pay | Admitting: Obstetrics

## 2016-12-21 ENCOUNTER — Other Ambulatory Visit: Payer: Self-pay

## 2016-12-21 ENCOUNTER — Ambulatory Visit (INDEPENDENT_AMBULATORY_CARE_PROVIDER_SITE_OTHER): Payer: Medicaid Other | Admitting: Obstetrics

## 2016-12-21 ENCOUNTER — Encounter: Payer: Self-pay | Admitting: *Deleted

## 2016-12-21 VITALS — BP 133/81 | HR 90 | Wt 157.0 lb

## 2016-12-21 DIAGNOSIS — Z3049 Encounter for surveillance of other contraceptives: Secondary | ICD-10-CM | POA: Diagnosis not present

## 2016-12-21 DIAGNOSIS — Z30017 Encounter for initial prescription of implantable subdermal contraceptive: Secondary | ICD-10-CM

## 2016-12-21 MED ORDER — ETONOGESTREL 68 MG ~~LOC~~ IMPL
68.0000 mg | DRUG_IMPLANT | Freq: Once | SUBCUTANEOUS | Status: AC
  Administered 2016-12-21: 68 mg via SUBCUTANEOUS

## 2016-12-21 NOTE — Progress Notes (Signed)
Presents for GYN visit, 7 weeks PP. Have not had sex since before delivery.  Wants the Nexplanon.  UPT today is NEGATIVE. Office stock used.

## 2016-12-21 NOTE — Progress Notes (Signed)
Nexplanon Procedure Note   PRE-OP DIAGNOSIS: desired long-term, reversible contraception ( LARC ) POST-OP DIAGNOSIS: Same  PROCEDURE: Nexplanon  placement Performing Provider: Bing Neighborsharles A. Clearance CootsHarper MD  Patient education prior to procedure, explained risk, benefits of Nexplanon, reviewed alternative options. Patient reported understanding. Gave consent to continue with procedure.   PROCEDURE:  Pregnancy Text :  Negative Site (check):      left arm         Sterile Preparation:   Betadinex3 Lot # [id number] Expiration Date 05 / 2021  Insertion site was selected 8 - 10 cm from medial epicondyle and marked along with guiding site using sterile marker. Procedure area was prepped and draped in a sterile fashion. 1% Lidocaine 1.5 ml given prior to procedure. Nexplanon  was inserted subcutaneously.Needle was removed from the insertion site. Nexplanon capsule was palpated by provider and patient to assure satisfactory placement. Dressing applied.  Followup: The patient tolerated the procedure well without complications.  Standard post-procedure care is explained and return precautions are given.  Charles A. Clearance CootsHarper MD

## 2017-01-05 ENCOUNTER — Ambulatory Visit: Payer: Medicaid Other | Admitting: Obstetrics

## 2018-01-15 IMAGING — US US MFM OB TRANSVAGINAL
1 series · 14 of 28 positions shown · non-contrast
Comparison: none

[Series 1: us mfm ob transvaginal · 72 acquisitions, 14 frames shown]
[im 3/72]
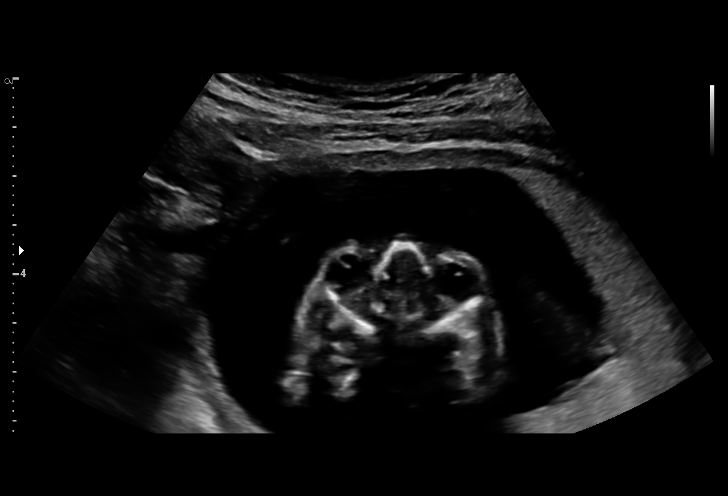
[im 8/72]
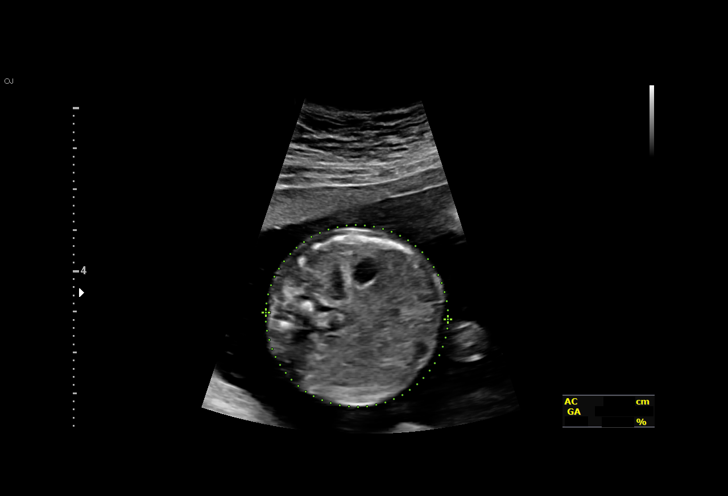
[im 14/72]
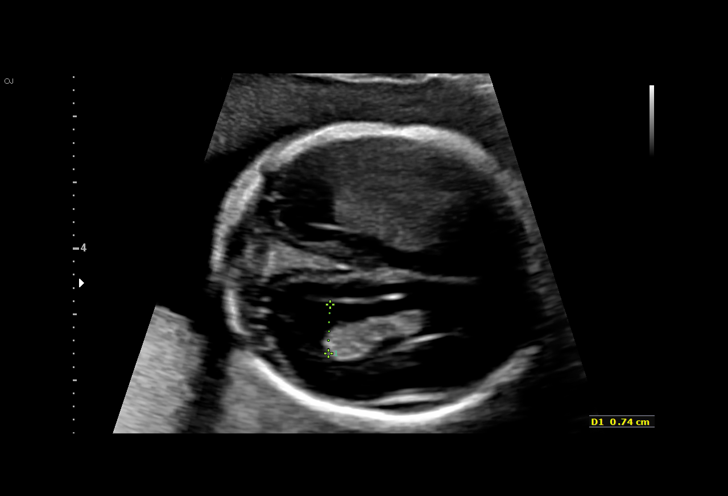
[im 19/72]
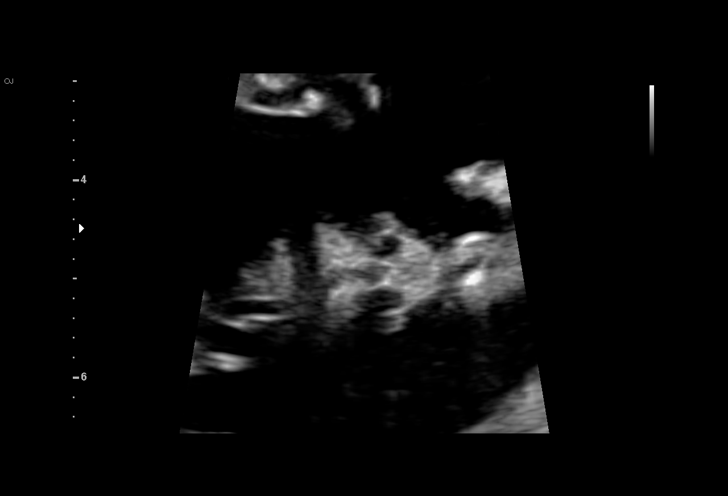
[im 24/72]
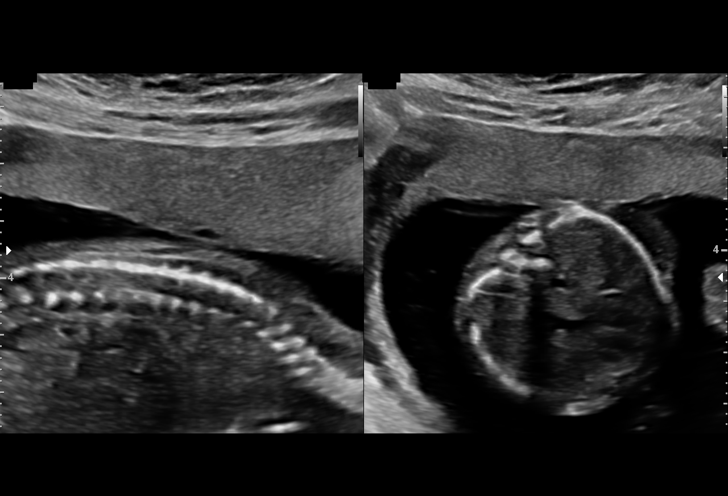
[im 29/72]
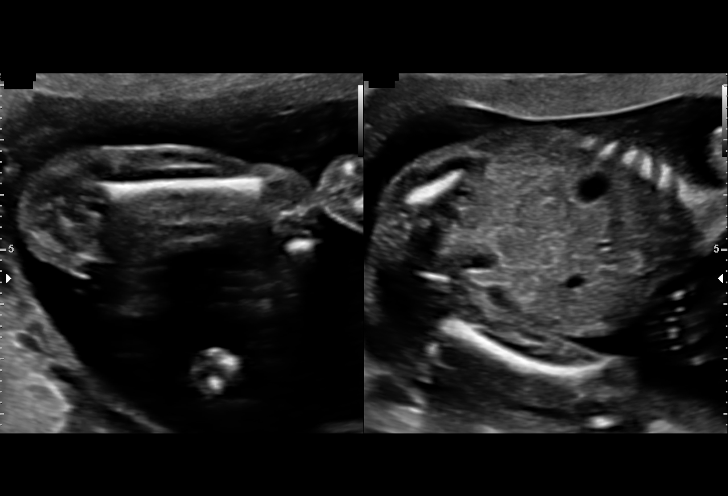
[im 35/72]
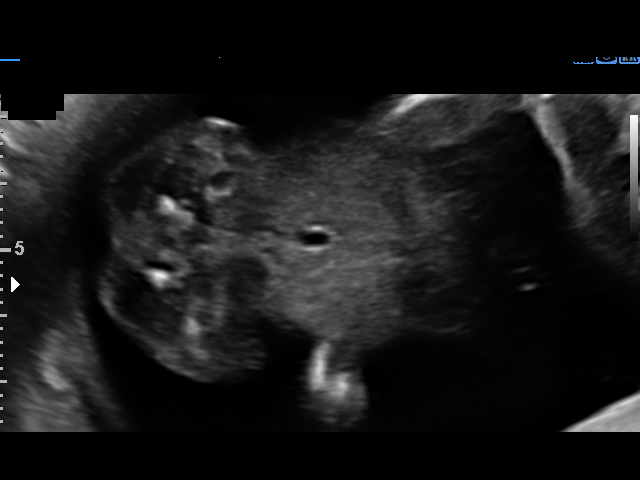
[im 40/72]
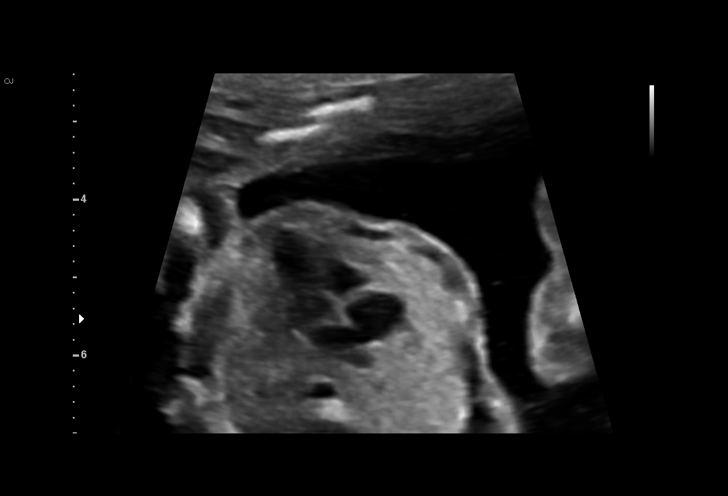
[im 45/72]
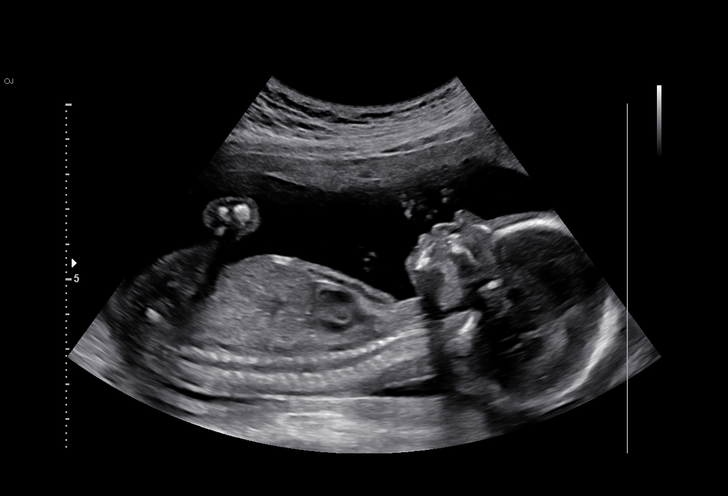
[im 50/72]
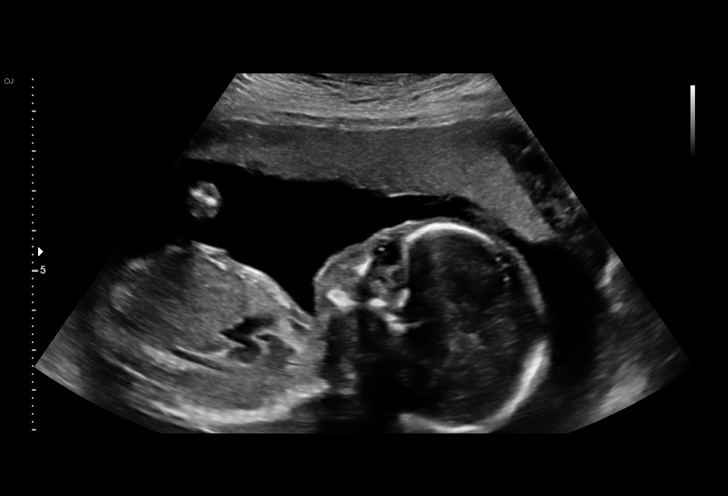
[im 56/72]
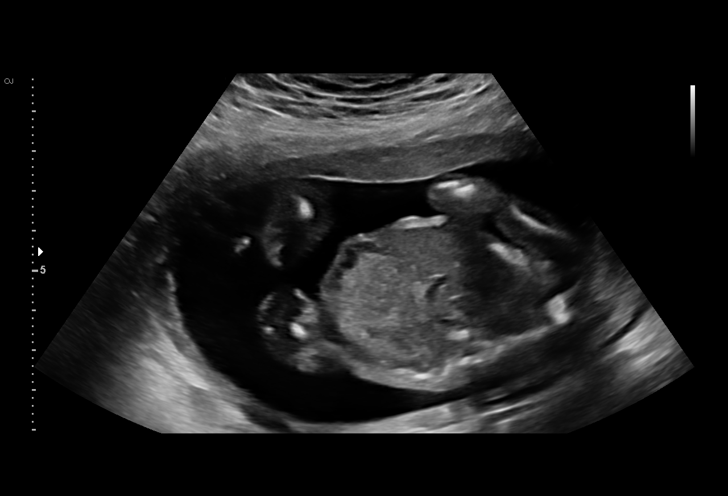
[im 61/72]
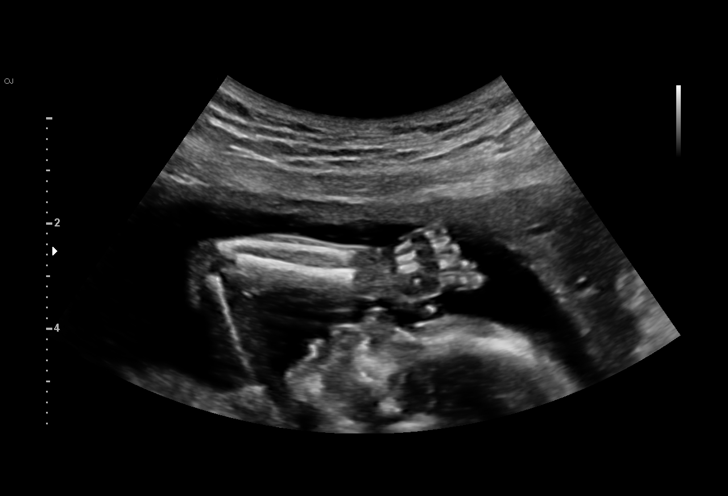
[im 66/72]
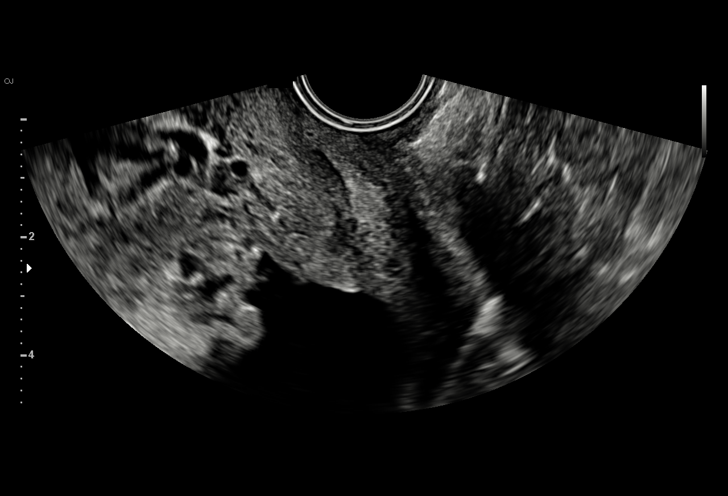
[im 72/72]
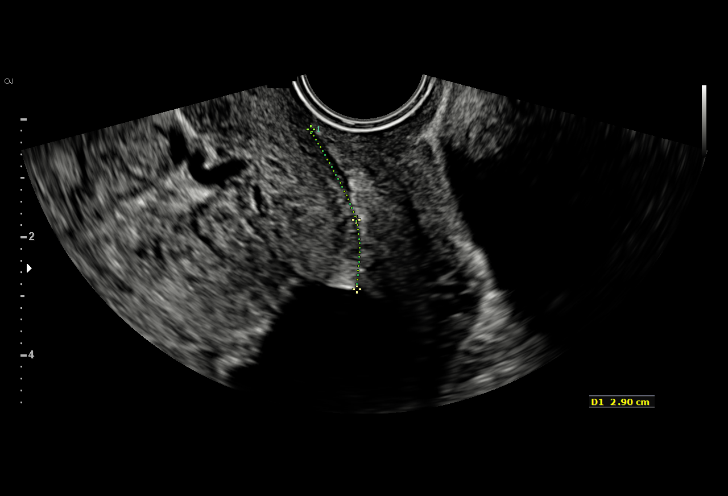

[14 of 28 positions shown; findings below may reference images not displayed]

am)

1  DYLSON MOANDA           [id number]      [id number]     [id number]
2  DYLSON MOANDA           [id number]      [id number]     [id number]
Indications

18 weeks gestation of pregnancy
Encounter for antenatal screening for
malformations
Encounter for cervical length
OB History

Gravidity:    4         Term:   2        Prem:   0        SAB:   1
TOP:          0       Ectopic:  0        Living: 2
Fetal Evaluation

Num Of Fetuses:     1
Cardiac Activity:   Observed
Presentation:       Cephalic
Placenta:           Anterior, above cervical os
P. Cord Insertion:  Visualized

Amniotic Fluid
AFI FV:      Subjectively within normal limits

Largest Pocket(cm)
4.2
Biometry

BPD:      44.6  mm     G. Age:  19w 3d         81  %    CI:        81.54   %   70 - 86
FL/HC:      18.7   %   16.1 -
HC:      155.9  mm     G. Age:  18w 3d         33  %    HC/AC:      1.12       1.09 -
AC:      138.8  mm     G. Age:  19w 2d         65  %    FL/BPD:     65.2   %
FL:       29.1  mm     G. Age:  19w 0d         52  %    FL/AC:      21.0   %   20 - 24
HUM:      27.9  mm     G. Age:  19w 0d         59  %
CER:      19.5  mm     G. Age:  18w 5d         51  %
NFT:       4.3  mm

CM:        3.9  mm
Est. FW:     273  gm    0 lb 10 oz      52  %
Gestational Age

LMP:           18w 5d       Date:   02/01/16                 EDD:   11/07/16
U/S Today:     19w 0d                                        EDD:   11/05/16
Best:          18w 5d    Det. By:   LMP  (02/01/16)          EDD:   11/07/16
Anatomy

Cranium:               Appears normal         Aortic Arch:            Appears normal
Cavum:                 Appears normal         Ductal Arch:            Appears normal
Ventricles:            Appears normal         Diaphragm:              Appears normal
Choroid Plexus:        Appears normal         Stomach:                Appears normal, left
sided
Cerebellum:            Appears normal         Abdomen:                Appears normal
Posterior Fossa:       Appears normal         Abdominal Wall:         Appears nml (cord
insert, abd wall)
Nuchal Fold:           Appears normal         Cord Vessels:           Appears normal (3
vessel cord)
Face:                  Appears normal         Kidneys:                Appear normal
(orbits and profile)
Lips:                  Appears normal         Bladder:                Appears normal
Thoracic:              Appears normal         Spine:                  Appears normal
Heart:                 Appears normal         Upper Extremities:      Appears normal
(4CH, axis, and situs
RVOT:                  Appears normal         Lower Extremities:      Appears normal
LVOT:                  Appears normal

Other:  Female gender. Heels visualized.
Cervix Uterus Adnexa

Cervix
Length:              3  cm.
Normal appearance by transvaginal scan

Uterus
No abnormality visualized.

Left Ovary
Size(cm)       2.1 x    1.3    x  2.2       Vol(ml):
Within normal limits.

Right Ovary
Size(cm)       3.4 x    1.2    x  2.9       Vol(ml):
Within normal limits.

Cul De Sac:   No free fluid seen.
Impression

Singleton intrauterine pregnancy at 18+5 weeks, here for
anatomic survey and cervical length
Review of the anatomy shows no sonographic markers for
aneuploidy or structural anomalies
Amniotic fluid volume is normal
Estimated fetal weight is 273g which is growth in the 52nd
percentile
Transvaginal cervical length 30-32mm without funnelling
Recommendations

Follow-up ultrasounds as clinically indicated.

## 2020-11-17 ENCOUNTER — Ambulatory Visit (INDEPENDENT_AMBULATORY_CARE_PROVIDER_SITE_OTHER): Payer: Medicaid Other | Admitting: Obstetrics and Gynecology

## 2020-11-17 ENCOUNTER — Other Ambulatory Visit: Payer: Self-pay

## 2020-11-17 ENCOUNTER — Other Ambulatory Visit (HOSPITAL_COMMUNITY)
Admission: RE | Admit: 2020-11-17 | Discharge: 2020-11-17 | Disposition: A | Discharge status: Home / Self Care | Payer: Medicaid Other | Source: Ambulatory Visit | Attending: Obstetrics and Gynecology | Admitting: Obstetrics and Gynecology

## 2020-11-17 ENCOUNTER — Encounter: Payer: Self-pay | Admitting: Obstetrics and Gynecology

## 2020-11-17 DIAGNOSIS — Z3490 Encounter for supervision of normal pregnancy, unspecified, unspecified trimester: Secondary | ICD-10-CM | POA: Insufficient documentation

## 2020-11-17 DIAGNOSIS — Z3480 Encounter for supervision of other normal pregnancy, unspecified trimester: Secondary | ICD-10-CM

## 2020-11-17 DIAGNOSIS — Z3A17 17 weeks gestation of pregnancy: Secondary | ICD-10-CM

## 2020-11-17 MED ORDER — GOJJI WEIGHT SCALE MISC: 1.0000 | 0 refills | Status: DC

## 2020-11-17 MED ORDER — BLOOD PRESSURE KIT DEVI: 1.0000 | 0 refills | Status: AC

## 2020-11-17 NOTE — Progress Notes (Signed)
  Subjective:    Kathleen Chapman is a [id number] [redacted]w[redacted]d being seen today for her first obstetrical visit.  Her obstetrical history is significant for grand multiparous and late onset to care at [redacted]w[redacted]d. Patient does intend to breast feed. Pregnancy history fully reviewed.  Patient reports no complaints.  Vitals:   11/17/20 1503  BP: 125/80  Pulse: (!) 121  Weight: 156 lb (70.8 kg)    HISTORY: OB History  Gravida Para Term Preterm AB Living  5 3 3   1 3   SAB IAB Ectopic Multiple Live Births  1     0 3    # Outcome Date GA Lbr Len/2nd Weight Sex Delivery Anes PTL Lv  5 Current           4 Term 10/28/16 [redacted]w[redacted]d 04:46 / 00:18 7 lb 3.9 oz (3.286 kg) F Vag-Spont None  LIV  3 Term 06/20/15 [redacted]w[redacted]d 01:10 / 00:04 6 lb 2.8 oz (2.8 kg) F Vag-Spont None  LIV  2 SAB 10/30/14          1 Term 12/27/08 [redacted]w[redacted]d   M Vag-Spont   LIV   History reviewed. No pertinent past medical history. History reviewed. No pertinent surgical history. Family History  Problem Relation Age of Onset  . Diabetes Maternal Grandmother   . Diabetes Maternal Grandfather   . Diabetes Paternal Grandmother   . Diabetes Paternal Grandfather   . Hypertension Mother      Exam    Uterus:   20-week size  Pelvic Exam:    Perineum: Normal Perineum   Vulva: normal   Vagina:  normal mucosa, normal discharge   pH:    Cervix: multiparous appearance and closed and long   Adnexa: not evaluated   Bony Pelvis: gynecoid  System: Breast:  normal appearance, no masses or tenderness   Skin: normal coloration and turgor, no rashes    Neurologic: oriented, no focal deficits   Extremities: normal strength, tone, and muscle mass   HEENT extra ocular movement intact   Mouth/Teeth mucous membranes moist, pharynx normal without lesions and dental hygiene good   Neck supple and no masses   Cardiovascular: regular rate and rhythm   Respiratory:  appears well, vitals normal, no respiratory distress, acyanotic, normal RR, chest clear, no  wheezing, crepitations, rhonchi, normal symmetric air entry   Abdomen: soft, non-tender; bowel sounds normal; no masses,  no organomegaly   Urinary:       Assessment:    Pregnancy: [redacted]w[redacted]d Patient Active Problem List   Diagnosis Date Noted  . Supervision of normal pregnancy, antepartum 11/17/2020        Plan:     Initial labs drawn. Prenatal vitamins. Problem list reviewed and updated. Genetic Screening discussed : Panorama and AFP ordered.  Ultrasound discussed; fetal survey: ordered. Patient is interested in waterbirth- information provided Patient planning BTL  Follow up in 4 weeks. 50% of 30 min visit spent on counseling and coordination of care.     Kaiah Hosea 11/17/2020

## 2020-11-17 NOTE — Patient Instructions (Addendum)
Considering Waterbirth? Guide for patients at [facility]) Why consider waterbirth? Gentle birth for babies  Less pain medicine used in labor  May allow for passive descent/less pushing  May reduce perineal tears  More mobility and instinctive maternal position changes  Increased maternal relaxation   Is waterbirth safe? What are the risks of infection, drowning or other complications? Infection:  Very low risk (3.7 % for tub vs 4.8% for bed)  7 in 8000 waterbirths with documented infection  Poorly cleaned equipment most common cause  Slightly lower group B strep transmission rate  Drowning  Maternal:  Very low risk  Related to seizures or fainting  Newborn:  Very low risk. No evidence of increased risk of respiratory problems in multiple large studies  Physiological protection from breathing under water  Avoid underwater birth if there are any fetal complications  Once baby's head is out of the water, keep it out.  Birth complication  Some reports of cord trauma, but risk decreased by bringing baby to surface gradually  No evidence of increased risk of shoulder dystocia. Mothers can usually change positions faster in water than in a bed, possibly aiding the maneuvers to free the shoulder.   There are 2 things you MUST do to have a waterbirth with [facility]: Attend a waterbirth class at [facility] at [facility]   3rd Wednesday of every month from 7-9 pm (virtual during COVID) [employer] at [email or web address] or [email or web address] or by calling [phone] Bring [place] the certificate from the class to your prenatal appointment or send via MyChart Meet with a midwife at 36 weeks* to see if you can still plan a waterbirth and to sign the consent.   *We also recommend that you schedule as many of your prenatal visits with a midwife as possible.    Helpful information: You may want to bring a bathing suit top to the hospital  to wear during labor but this is optional.  All other supplies are provided by the hospital. Please arrive at the hospital with signs of active labor, and do not wait at home until late in labor. It takes 45 min- 2 hours for COVID testing, fetal monitoring, and check in to your room to take place, plus transport and filling of the waterbirth tub.    Things that would prevent you from having a waterbirth: Unknown or Positive COVID-19 diagnosis upon admission to hospital* Premature, <37wks  Previous cesarean birth  Presence of thick meconium-stained fluid  Multiple gestation (Twins, triplets, etc.)  Uncontrolled diabetes or gestational diabetes requiring medication  Hypertension diagnosed in pregnancy or preexisting hypertension (gestational hypertension, preeclampsia, or chronic hypertension) Fetal growth restriction (your baby measures less than 10th percentile on ultrasound) Heavy vaginal bleeding  Non-reassuring fetal heart rate  Active infection (MRSA, etc.). Group B Strep is NOT a contraindication for waterbirth.  If your labor has to be induced and induction method requires continuous monitoring of the baby's heart rate  Other risks/issues identified by your obstetrical provider   Please remember that birth is unpredictable. Under certain unforeseeable circumstances your provider may advise against giving birth in the tub. These decisions will be made on a case-by-case basis and with the safety of you and your baby as our highest priority.   *Please remember that in order to have a waterbirth, you must test Negative to COVID-19 upon admission to the hospital.  Updated 04/14/20    Second Trimester of Pregnancy The second trimester of pregnancy  is from week 13 through week 27. This is months 4 through 6 of pregnancy. The second trimester is often a time when you feel your best. Your body has adjusted to being pregnant, and you begin to feel better physically. During the second  trimester: Morning sickness has lessened or stopped completely. You may have more energy. You may have an increase in appetite. The second trimester is also a time when the unborn baby (fetus) is growing rapidly. At the end of the sixth month, the fetus may be up to 12 inches long and weigh about 1 pounds. You will likely begin to feel the baby move (quickening) between 16 and 20 weeks of pregnancy. Body changes during your second trimester Your body continues to go through many changes during your second trimester. The changes vary and generally return to normal after the baby is born. Physical changes Your weight will continue to increase. You will notice your lower abdomen bulging out. You may begin to get stretch marks on your hips, abdomen, and breasts. Your breasts will continue to grow and to become tender. Dark spots or blotches (chloasma or mask of pregnancy) may develop on your face. A dark line from your belly button to the pubic area (linea nigra) may appear. You may have changes in your hair. These can include thickening of your hair, rapid growth, and changes in texture. Some people also have hair loss during or after pregnancy, or hair that feels dry or thin. Health changes You may develop headaches. You may have heartburn. You may develop constipation. You may develop hemorrhoids or swollen, bulging veins (varicose veins). Your gums may bleed and may be sensitive to brushing and flossing. You may urinate more often because the fetus is pressing on your bladder. You may have back pain. This is caused by: Weight gain. Pregnancy hormones that are relaxing the joints in your pelvis. A shift in weight and the muscles that support your balance. Follow these instructions at home: Medicines Follow your health care provider's instructions regarding medicine use. Specific medicines may be either safe or unsafe to take during pregnancy. Do not take any medicines unless approved by  your health care provider. Take a prenatal vitamin that contains at least 600 micrograms (mcg) of folic acid. Eating and drinking Eat a healthy diet that includes fresh fruits and vegetables, whole grains, good sources of protein such as meat, eggs, or tofu, and low-fat dairy products. Avoid raw meat and unpasteurized juice, milk, and cheese. These carry germs that can harm you and your baby. You may need to take these actions to prevent or treat constipation: Drink enough fluid to keep your urine pale yellow. Eat foods that are high in fiber, such as beans, whole grains, and fresh fruits and vegetables. Limit foods that are high in fat and processed sugars, such as fried or sweet foods. Activity Exercise only as directed by your health care provider. Most people can continue their usual exercise routine during pregnancy. Try to exercise for 30 minutes at least 5 days a week. Stop exercising if you develop contractions in your uterus. Stop exercising if you develop pain or cramping in the lower abdomen or lower back. Avoid exercising if it is very hot or humid or if you are at a high altitude. Avoid heavy lifting. If you choose to, you may have sex unless your health care provider tells you not to. Relieving pain and discomfort Wear a supportive bra to prevent discomfort from breast tenderness. Take warm  sitz baths to soothe any pain or discomfort caused by hemorrhoids. Use hemorrhoid cream if your health care provider approves. Rest with your legs raised (elevated) if you have leg cramps or low back pain. If you develop varicose veins: Wear support hose as told by your health care provider. Elevate your feet for 15 minutes, 3-4 times a day. Limit salt in your diet. Safety Wear your seat belt at all times when driving or riding in a car. Talk with your health care provider if someone is verbally or physically abusive to you. Lifestyle Do not use hot tubs, steam rooms, or saunas. Do not  douche. Do not use tampons or scented sanitary pads. Avoid cat litter boxes and soil used by cats. These carry germs that can cause birth defects in the baby and possibly loss of the fetus by miscarriage or stillbirth. Do not use herbal remedies, alcohol, illegal drugs, or medicines that are not approved by your health care provider. Chemicals in these products can harm your baby. Do not use any products that contain nicotine or tobacco, such as cigarettes, e-cigarettes, and chewing tobacco. If you need help quitting, ask your health care provider. General instructions During a routine prenatal visit, your health care provider will do a physical exam and other tests. He or she will also discuss your overall health. Keep all follow-up visits. This is important. Ask your health care provider for a referral to a local prenatal education class. Ask for help if you have counseling or nutritional needs during pregnancy. Your health care provider can offer advice or refer you to specialists for help with various needs. Where to find more information American Pregnancy Association: americanpregnancy.org [employer]: https://www.todd-brady.net/ Office on [employer] [facility]: MightyReward.co.nz Contact a health care provider if you have: A headache that does not go away when you take medicine. Vision changes or you see spots in front of your eyes. Mild pelvic cramps, pelvic pressure, or nagging pain in the abdominal area. Persistent nausea, vomiting, or diarrhea. A bad-smelling vaginal discharge or foul-smelling urine. Pain when you urinate. Sudden or extreme swelling of your face, hands, ankles, feet, or legs. A fever. Get help right away if you: Have fluid leaking from your vagina. Have spotting or bleeding from your vagina. Have severe abdominal cramping or pain. Have difficulty breathing. Have chest pain. Have fainting spells. Have not felt  your baby move for the time period told by your health care provider. Have new or increased pain, swelling, or redness in an arm or leg. Summary The second trimester of pregnancy is from week 13 through week 27 (months 4 through 6). Do not use herbal remedies, alcohol, illegal drugs, or medicines that are not approved by your health care provider. Chemicals in these products can harm your baby. Exercise only as directed by your health care provider. Most people can continue their usual exercise routine during pregnancy. Keep all follow-up visits. This is important. This information is not intended to replace advice given to you by your health care provider. Make sure you discuss any questions you have with your health care provider. Document Revised: 05/30/2019 Document Reviewed: 04/05/2019 Elsevier Patient Education  2022 [employer].  Contraception Choices Contraception, also called birth control, refers to methods or devices that prevent pregnancy. Hormonal methods Contraceptive implant A contraceptive implant is a thin, plastic tube that contains a hormone that prevents pregnancy. It is different from an intrauterine device (IUD). It is inserted into the upper part of the  arm by a health care provider. Implants can be effective for up to 3 years. Progestin-only injections Progestin-only injections are injections of progestin, a synthetic form of the hormone progesterone. They are given every 3 months by a health care provider. Birth control pills Birth control pills are pills that contain hormones that prevent pregnancy. They must be taken once a day, preferably at the same time each day. A prescription is needed to use this method of contraception. Birth control patch The birth control patch contains hormones that prevent pregnancy. It is placed on the skin and must be changed once a week for three weeks and removed on the fourth week. A prescription is needed to use this method of  contraception. Vaginal ring A vaginal ring contains hormones that prevent pregnancy. It is placed in the vagina for three weeks and removed on the fourth week. After that, the process is repeated with a new ring. A prescription is needed to use this method of contraception. Emergency contraceptive Emergency contraceptives prevent pregnancy after unprotected sex. They come in pill form and can be taken up to 5 days after sex. They work best the sooner they are taken after having sex. Most emergency contraceptives are available without a prescription. This method should not be used as your only form of birth control. Barrier methods Female condom A female condom is a thin sheath that is worn over the penis during sex. Condoms keep sperm from going inside a woman's body. They can be used with a sperm-killing substance (spermicide) to increase their effectiveness. They should be thrown away after one use. Female condom A female condom is a soft, loose-fitting sheath that is put into the vagina before sex. The condom keeps sperm from going inside a woman's body. They should be thrown away after one use. Diaphragm A diaphragm is a soft, dome-shaped barrier. It is inserted into the vagina before sex, along with a spermicide. The diaphragm blocks sperm from entering the uterus, and the spermicide kills sperm. A diaphragm should be left in the vagina for 6-8 hours after sex and removed within 24 hours. A diaphragm is prescribed and fitted by a health care provider. A diaphragm should be replaced every 1-2 years, after giving birth, after gaining more than 15 lb (6.8 kg), and after pelvic surgery. Cervical cap A cervical cap is a round, soft latex or plastic cup that fits over the cervix. It is inserted into the vagina before sex, along with spermicide. It blocks sperm from entering the uterus. The cap should be left in place for 6-8 hours after sex and removed within 48 hours. A cervical cap must be prescribed and  fitted by a health care provider. It should be replaced every 2 years. Sponge A sponge is a soft, circular piece of polyurethane foam with spermicide in it. The sponge helps block sperm from entering the uterus, and the spermicide kills sperm. To use it, you make it wet and then insert it into the vagina. It should be inserted before sex, left in for at least 6 hours after sex, and removed and thrown away within 30 hours. Spermicides Spermicides are chemicals that kill or block sperm from entering the cervix and uterus. They can come as a cream, jelly, suppository, foam, or tablet. A spermicide should be inserted into the vagina with an applicator at least 10-15 minutes before sex to allow time for it to work. The process must be repeated every time you have sex. Spermicides do not require a  prescription. Intrauterine contraception Intrauterine device (IUD) An IUD is a T-shaped device that is put in a woman's uterus. There are two types: Hormone IUD.This type contains progestin, a synthetic form of the hormone progesterone. This type can stay in place for 3-5 years. Copper IUD.This type is wrapped in copper wire. It can stay in place for 10 years. Permanent methods of contraception Female tubal ligation In this method, a woman's fallopian tubes are sealed, tied, or blocked during surgery to prevent eggs from traveling to the uterus. Hysteroscopic sterilization In this method, a small, flexible insert is placed into each fallopian tube. The inserts cause scar tissue to form in the fallopian tubes and block them, so sperm cannot reach an egg. The procedure takes about 3 months to be effective. Another form of birth control must be used during those 3 months. Female sterilization This is a procedure to tie off the tubes that carry sperm (vasectomy). After the procedure, the man can still ejaculate fluid (semen). Another form of birth control must be used for 3 months after the procedure. Natural planning  methods Natural family planning In this method, a couple does not have sex on days when the woman could become pregnant. Calendar method In this method, the woman keeps track of the length of each menstrual cycle, identifies the days when pregnancy can happen, and does not have sex on those days. Ovulation method In this method, a couple avoids sex during ovulation. Symptothermal method This method involves not having sex during ovulation. The woman typically checks for ovulation by watching changes in her temperature and in the consistency of cervical mucus. Post-ovulation method In this method, a couple waits to have sex until after ovulation. Where to find more information Centers for Disease Control and Prevention: FootballExhibition.com.br Summary Contraception, also called birth control, refers to methods or devices that prevent pregnancy. Hormonal methods of contraception include implants, injections, pills, patches, vaginal rings, and emergency contraceptives. Barrier methods of contraception can include female condoms, female condoms, diaphragms, cervical caps, sponges, and spermicides. There are two types of IUDs (intrauterine devices). An IUD can be put in a woman's uterus to prevent pregnancy for 3-5 years. Permanent sterilization can be done through a procedure for males and females. Natural family planning methods involve nothaving sex on days when the woman could become pregnant. This information is not intended to replace advice given to you by your health care provider. Make sure you discuss any questions you have with your health care provider. Document Revised: 05/28/2019 Document Reviewed: 05/28/2019 Elsevier Patient Education  2022 [employer].

## 2020-11-18 LAB — CERVICOVAGINAL ANCILLARY ONLY
Chlamydia: NEGATIVE
Comment: NEGATIVE
Comment: NORMAL
Neisseria Gonorrhea: NEGATIVE

## 2020-11-19 LAB — AFP, SERUM, OPEN SPINA BIFIDA
AFP MoM: 1.45
AFP Value: 67.3 ng/mL
Gest. Age on Collection Date: 17.6 weeks
Maternal Age At EDD: 29.7 yr
OSBR Risk 1 IN: 6265
Test Results:: NEGATIVE
Weight: 156 [lb_av]

## 2020-11-19 LAB — OBSTETRIC PANEL, INCLUDING HIV
Antibody Screen: NEGATIVE
Basophils Absolute: 0 10*3/uL (ref 0.0–0.2)
Basos: 0 %
EOS (ABSOLUTE): 0.1 10*3/uL (ref 0.0–0.4)
Eos: 1 %
HIV Screen 4th Generation wRfx: NONREACTIVE
Hematocrit: 36.4 % (ref 34.0–46.6)
Hemoglobin: 12.4 g/dL (ref 11.1–15.9)
Hepatitis B Surface Ag: NEGATIVE
Immature Grans (Abs): 0.1 10*3/uL (ref 0.0–0.1)
Immature Granulocytes: 1 %
Lymphocytes Absolute: 2.9 10*3/uL (ref 0.7–3.1)
Lymphs: 23 %
MCH: 31.5 pg (ref 26.6–33.0)
MCHC: 34.1 g/dL (ref 31.5–35.7)
MCV: 92 fL (ref 79–97)
Monocytes Absolute: 0.7 10*3/uL (ref 0.1–0.9)
Monocytes: 5 %
Neutrophils Absolute: 8.9 10*3/uL — ABNORMAL HIGH (ref 1.4–7.0)
Neutrophils: 70 %
Platelets: 281 10*3/uL (ref 150–450)
RBC: 3.94 x10E6/uL (ref 3.77–5.28)
RDW: 12.9 % (ref 11.7–15.4)
RPR Ser Ql: NONREACTIVE
Rh Factor: POSITIVE
Rubella Antibodies, IGG: 1.31 index (ref >0.99)
WBC: 12.5 10*3/uL — ABNORMAL HIGH (ref 3.4–10.8)

## 2020-11-19 LAB — HEPATITIS C ANTIBODY: Hep C Virus Ab: <0.1 s/co ratio (ref 0.0–0.9)

## 2020-11-20 LAB — CYTOLOGY - PAP
Comment: NEGATIVE
Diagnosis: NEGATIVE
High risk HPV: NEGATIVE

## 2020-11-24 ENCOUNTER — Encounter: Payer: Self-pay | Admitting: Obstetrics and Gynecology

## 2020-12-08 ENCOUNTER — Ambulatory Visit: Payer: Medicaid Other

## 2020-12-12 ENCOUNTER — Other Ambulatory Visit: Payer: Self-pay

## 2020-12-12 ENCOUNTER — Ambulatory Visit: Payer: Medicaid Other | Attending: Obstetrics and Gynecology

## 2020-12-12 DIAGNOSIS — Z3490 Encounter for supervision of normal pregnancy, unspecified, unspecified trimester: Secondary | ICD-10-CM | POA: Insufficient documentation

## 2020-12-15 ENCOUNTER — Encounter: Payer: Medicaid Other | Admitting: Obstetrics and Gynecology

## 2020-12-16 ENCOUNTER — Other Ambulatory Visit: Payer: Self-pay

## 2020-12-16 ENCOUNTER — Telehealth (INDEPENDENT_AMBULATORY_CARE_PROVIDER_SITE_OTHER): Payer: Medicaid Other | Admitting: Obstetrics and Gynecology

## 2020-12-16 DIAGNOSIS — Z3480 Encounter for supervision of other normal pregnancy, unspecified trimester: Secondary | ICD-10-CM

## 2020-12-16 DIAGNOSIS — Z3A22 22 weeks gestation of pregnancy: Secondary | ICD-10-CM

## 2020-12-16 NOTE — Progress Notes (Signed)
OBSTETRICS PRENATAL VIRTUAL VISIT ENCOUNTER NOTE  Provider location: [facility]   Patient location: Home  I connected with Kathleen Chapman on 12/16/20 at  3:30 PM EST by MyChart Video Encounter and verified that I am speaking with the correct person using two identifiers. I discussed the limitations, risks, security and privacy concerns of performing an evaluation and management service virtually and the availability of in person appointments. I also discussed with the patient that there may be a patient responsible charge related to this service. The patient expressed understanding and agreed to proceed. Subjective:  Kathleen Chapman is a 30 y.o. G5P3013 at [redacted]w[redacted]d being seen today for ongoing prenatal care.  She is currently monitored for the following issues for this low-risk pregnancy and has Supervision of normal pregnancy, antepartum and [redacted] weeks gestation of pregnancy on their problem list.  Patient reports no complaints.  Contractions: Not present. Vag. Bleeding: None.   . Denies any leaking of fluid.   Questions answered regarding tylenol with codeine from dentist.  Pt reassured it was ok to take for short intervals.  The following portions of the patient's history were reviewed and updated as appropriate: allergies, current medications, past family history, past medical history, past social history, past surgical history and problem list.   Objective:  There were no vitals filed for this visit.  Fetal Status:           General:  Alert, oriented and cooperative. Patient is in no acute distress.  Respiratory: Normal respiratory effort, no problems with respiration noted  Mental Status: Normal mood and affect. Normal behavior. Normal judgment and thought content.  Rest of physical exam deferred due to type of encounter  Imaging: [place] MFM OB COMP + 14 WK  Result Date: 12/12/2020 ----------------------------------------------------------------------   OBSTETRICS REPORT                       (Signed Final 12/12/2020 08:54 am) ---------------------------------------------------------------------- Patient Info  ID #:       [id number]                          D.O.B.:  May 07, 1990 (30 yrs)  Name:       Kathleen Chapman                Visit Date: 12/12/2020 07:51 am ---------------------------------------------------------------------- Performed By  Attending:        Lin Landsman      Ref. Address:     [address]                    [address]                                                             [address]                                                             [id number]  [place]                                                             [id number]  Performed By:     Eden Lathe BS      [facility]                                 [facility]  Referred By:      [facility] ---------------------------------------------------------------------- Orders  #  Description                           Code        Ordered By  1  [place] MFM OB COMP + 14 WK                76805.01    Kathleen Chapman ----------------------------------------------------------------------  #  Order #                     Accession #                Episode #  1  [id number]                   [id number]                 [id number] ---------------------------------------------------------------------- Indications  [redacted] weeks gestation of pregnancy                Z3A.21  Antenatal screening for malformations          Z36.3  Low risk NIPS, neg AFP ---------------------------------------------------------------------- Fetal Evaluation  Num Of Fetuses:         1  Cardiac Activity:       Observed  Presentation:           Cephalic  Placenta:               Posterior  P.  Cord Insertion:      Visualized  Amniotic Fluid  AFI FV:      Within normal limits                              Largest Pocket(cm)  5.3 ---------------------------------------------------------------------- Biometry  BPD:      50.5  mm     G. Age:  21w 2d         43  %    CI:        74.51   %    70 - 86                                                          FL/HC:      19.1   %    15.9 - 20.3  HC:      185.7  mm     G. Age:  20w 6d         19  %    HC/AC:      1.15        1.06 - 1.25  AC:      160.9  mm     G. Age:  21w 1d         35  %    FL/BPD:     70.3   %  FL:       35.5  mm     G. Age:  21w 2d         33  %    FL/AC:      22.1   %    20 - 24  HUM:      32.7  mm     G. Age:  21w 0d         37  %  CER:      22.2  mm     G. Age:  20w 6d         45  %  LV:        4.9  mm  CM:        4.7  mm  Est. FW:     405  gm    0 lb 14 oz      32  % ---------------------------------------------------------------------- OB History  Gravidity:    5         Term:   3        Prem:   0        SAB:   1  TOP:          0       Ectopic:  0        Living: 3 ---------------------------------------------------------------------- Gestational Age  LMP:           21w 3d        Date:  07/15/20                 EDD:   04/21/21  U/S Today:     21w 1d                                        EDD:   04/23/21  Best:          Kathleen Chapman 3d     Det. By:  LMP  (07/15/20)          EDD:   04/21/21 ---------------------------------------------------------------------- Anatomy  Cranium:  Appears normal         Aortic Arch:            Appears normal  Cavum:                 Appears normal         Ductal Arch:            Appears normal  Ventricles:            Appears normal         Diaphragm:              Appears normal  Choroid Plexus:        Appears normal         Stomach:                Appears normal, left                                                                        sided  Cerebellum:            Appears normal          Abdomen:                Appears normal  Posterior Fossa:       Appears normal         Abdominal Wall:         Appears nml (cord                                                                        insert, abd wall)  Nuchal Fold:           Not applicable (>20    Cord Vessels:           Appears normal ([redacted]                         wks GA)                                        vessel cord)  Face:                  Appears normal         Kidneys:                Appear normal                         (orbits and profile)  Lips:                  Appears normal         Bladder:                Appears normal  Thoracic:  Appears normal         Spine:                  Appears normal  Heart:                 Appears normal         Upper Extremities:      Appears normal                         (4CH, axis, and                         situs)  RVOT:                  Appears normal         Lower Extremities:      Appears normal  LVOT:                  Appears normal  Other:  VC, 3VV and 3VTV visualized. Heels/feet and open hands/5th digits          visualized. Lenses, nasal bone, maxilla and mandible visualized. ---------------------------------------------------------------------- Cervix Uterus Adnexa  Cervix  Length:           3.51  cm.  Normal appearance by transabdominal scan.  Uterus  No abnormality visualized.  Right Ovary  Within normal limits.  Left Ovary  Within normal limits.  Cul De Sac  No free fluid seen.  Adnexa  No abnormality visualized. ---------------------------------------------------------------------- Impression  Single intrauterine pregnancy here for a complete anatomy  Normal anatomy with measurements consistent with dates  There is good fetal movement and amniotic fluid volume  Ms. Kilbride had a low risk NIPS and AFP. ---------------------------------------------------------------------- Recommendations  Follow up as clinically indicated.  ----------------------------------------------------------------------               Lin Landsman, MD Electronically Signed Final Report   12/12/2020 08:54 am ----------------------------------------------------------------------   Assessment and Plan:  Pregnancy: [id number] at [redacted]w[redacted]d 1. [redacted] weeks gestation of pregnancy   2. Supervision of other normal pregnancy, antepartum Continue routine care  Preterm labor symptoms and general obstetric precautions including but not limited to vaginal bleeding, contractions, leaking of fluid and fetal movement were reviewed in detail with the patient. I discussed the assessment and treatment plan with the patient. The patient was provided an opportunity to ask questions and all were answered. The patient agreed with the plan and demonstrated an understanding of the instructions. The patient was advised to call back or seek an in-person office evaluation/go to [facility] at [facility] for any urgent or concerning symptoms. Please refer to After Visit Summary for other counseling recommendations.   I provided 10 minutes of face-to-face time during this encounter.  Return in about 4 weeks (around 01/13/2021) for ROB, in person.  No future appointments.  Warden Fillers, MD [facility] [employer], [facility]

## 2020-12-16 NOTE — Progress Notes (Signed)
BP 126/71 at home yesterday. No recent weight. FHTs unavailable/ e-visit.

## 2021-01-01 ENCOUNTER — Other Ambulatory Visit: Payer: Self-pay

## 2021-01-01 DIAGNOSIS — Z148 Genetic carrier of other disease: Secondary | ICD-10-CM

## 2021-01-04 NOTE — L&D Delivery Note (Addendum)
Delivery Note ?Kathleen Chapman presented in active labor and had AROM to help augment her labor. Progressed rapidly to 8cm and then complete.  Patient labored unmedicated. She delivered infants head in 1-2 pushes and the fist was preventing explusion. I gently grasped the hand and straightened the arm delivering the posterior arm and the infant easily exited. Father cut the cord.  ? ?At 4:33 PM a viable female was delivered via Vaginal, Spontaneous (Presentation: Left Occiput Anterior, compound presentation with infant right fist by face).  APGAR: 9, 9; weight 7 lb (3175 g).   ?Placenta status: Spontaneous, Intact.  Cord: 3 vessels with the following complications: None.  Cord pH: not collected ? ?Anesthesia: None ?Episiotomy: None ?Lacerations: None ?Suture Repair:  NA ?Est. Blood Loss (mL): 50 ? ?Mom to postpartum.  Baby to Couplet care / Skin to Skin. ? ?Federico Flake ?04/12/2021, 6:32 PM ? ? ? ?

## 2021-01-22 ENCOUNTER — Other Ambulatory Visit: Payer: Self-pay

## 2021-01-22 ENCOUNTER — Ambulatory Visit: Payer: Medicaid Other | Attending: Obstetrics and Gynecology

## 2021-01-22 ENCOUNTER — Encounter: Payer: Self-pay | Admitting: Obstetrics and Gynecology

## 2021-01-22 ENCOUNTER — Ambulatory Visit (INDEPENDENT_AMBULATORY_CARE_PROVIDER_SITE_OTHER): Payer: Medicaid Other | Admitting: Obstetrics and Gynecology

## 2021-01-22 DIAGNOSIS — Z3480 Encounter for supervision of other normal pregnancy, unspecified trimester: Secondary | ICD-10-CM

## 2021-01-22 DIAGNOSIS — Z315 Encounter for genetic counseling: Secondary | ICD-10-CM | POA: Diagnosis not present

## 2021-01-22 MED ORDER — PANTOPRAZOLE SODIUM 40 MG PO TBEC: 40.0000 mg | DELAYED_RELEASE_TABLET | Freq: Every day | ORAL | 2 refills | Status: DC

## 2021-01-22 MED ORDER — COMFORT FIT MATERNITY SUPP MED MISC: 0 refills | Status: AC

## 2021-01-22 NOTE — Progress Notes (Signed)
Patient presents for ROB. Patient request rf for prilosec. Patient has not completed GTT yet. No other concerns.

## 2021-01-22 NOTE — Progress Notes (Signed)
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   EDC: 04/21/2021 LMP: 07/15/2020 Referring Provider:  Warden Fillers, MD   EGA: [redacted]w[redacted]d Genetic Counselor: Teena Dunk, MS, CGC  OB Hx: [id number] Date of Appointment: 01/22/2021  Accompanied by: Her reproductive partner Face to Face Time: 30 Minutes   Previous Testing Completed: Kathleen Chapman previously completed Non-Invasive Prenatal Screening (NIPS) in this pregnancy (scanned into Epic under the Media tab). The result is low risk, consistent with a female fetus. This screening significantly reduces the risk that the current pregnancy has Down syndrome, Trisomy 9, Trisomy 13, Monosomy X, and Triploidy, however, the risk is not zero given the limitations of NIPS. Additionally, there are many genetic conditions that cannot be detected by NIPS.  Kathleen Chapman previously completed carrier screening (scanned into Epic under the Media tab). She screened to have an increased chance to be a silent carrier for Spinal Muscular Atrophy (SMA). She screened to not be a carrier for Cystic Fibrosis (CF), alpha thalassemia, and beta hemoglobinopathies. A negative result on carrier screening reduces the likelihood of being a carrier, however, does not entirely rule out the possibility. Kathleen Chapman previously completed a maternal serum AFP screen in this pregnancy. The result is screen negative. A negative result reduces the risk that the current pregnancy has an open neural tube defect. Closed neural tube defects and some open defects may not be detected by this screen.    Medical History:  This is Kathleen Chapman's 5th pregnancy. She has 3 living children (two girls and one boy). She has had 1 early loss. Reports she takes prenatal vitamins. Denies having bleeding, illness, fever, and rashes in this pregnancy. Denies using tobacco, alcohol, or street drugs in this pregnancy.   Family History:  Maternal ethnicity  reported as African American and paternal ethnicity reported as African Naval architect. Denies Jewish ancestry. Kathleen Chapman denied a family history of consanguinity, intellectual disability, birth defects, blindness, deafness, chromosome conditions, cystic fibrosis, muscular dystrophy, blood disorders, stillbirths, early infant deaths, and 3 or more miscarriages for one person on her prenatal screening questionnaire.      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 SMA is an autosomal recessive disease caused by loss of function mutations in the SMN1 gene. Kathleen Chapman screened to have two functional copies of the SMN1 gene, however, due to limitations of genetic screening the laboratory cannot confirm if Kathleen Chapman's two copies are on the same chromosome or on opposite chromosomes. The laboratory identified the variant [id number] in Kathleen Chapman's screening, meaning there is increased risk for Kathleen Chapman's two copies of the SMN1 gene to be on the same chromosome. Specifically, given Kathleen Chapman's ethnic background, her risk to be a silent carrier is approximately 1 in 42. Genetic counseling reviewed with Kathleen Chapman that if her two copies are on the same chromosome that means her other chromosome would have zero copies, and there could be risk for an affected pregnancy if her reproductive partner is found to be a carrier for SMA. Individuals with SMA have progressive, proximal, and symmetrical muscle weakness and atrophy due to degeneration of motor neurons of the spine and brainstem. Symptoms can first appear at any time between before birth to adulthood. There are five types of SMA that are historically classified based on clinical presentation, onset of symptoms, and the maximum skill level of motor milestones achieved, however, there is a lot of overlap between types. There are several treatments  available for individuals affected with SMA and studies show that treatment is most  effective when it is started in the first few months of life. Given Kathleen Chapman's carrier screening result, genetic counseling offered screening her reproductive partner for SMA. The couple verbalized understanding of the information discussed and declined further carrier screening for SMA. Genetic counseling reviewed with the couple that SMA is included in Kathleen Chapman's newborn screening program.      Patient Plan:  Proceed with: Routine prenatal care Informed consent was obtained. All questions were answered.  Declined: Carrier Screening for SMA for Kathleen Chapman's reproductive partner.   Thank you for sharing in the care of Kathleen Chapman with [place].  Please do not hesitate to contact us if you have any questions.  Teena Dunk, MS, [facility]

## 2021-01-22 NOTE — Progress Notes (Signed)
° °  PRENATAL VISIT NOTE  Subjective:  Kathleen Chapman is a 31 y.o. G5P3013 at [redacted]w[redacted]d being seen today for ongoing prenatal care.  She is currently monitored for the following issues for this low-risk pregnancy and has Supervision of normal pregnancy, antepartum and [redacted] weeks gestation of pregnancy on their problem list.  Patient reports no complaints.  Contractions: Not present. Vag. Bleeding: None.  Movement: Present. Denies leaking of fluid.   The following portions of the patient's history were reviewed and updated as appropriate: allergies, current medications, past family history, past medical history, past social history, past surgical history and problem list.   Objective:   Vitals:   01/22/21 1050  BP: 115/77  Pulse: (!) 102  Weight: 165 lb 4.8 oz (75 kg)    Fetal Status: Fetal Heart Rate (bpm): 140 Fundal Height: 28 cm Movement: Present     General:  Alert, oriented and cooperative. Patient is in no acute distress.  Skin: Skin is warm and dry. No rash noted.   Cardiovascular: Normal heart rate noted  Respiratory: Normal respiratory effort, no problems with respiration noted  Abdomen: Soft, gravid, appropriate for gestational age.  Pain/Pressure: Absent     Pelvic: Cervical exam deferred        Extremities: Normal range of motion.  Edema: None  Mental Status: Normal mood and affect. Normal behavior. Normal judgment and thought content.   Assessment and Plan:  Pregnancy: [id number] at [redacted]w[redacted]d 1. Supervision of other normal pregnancy, antepartum Patient is doing well without complaints Patient is not fasting and will return tomorrow for third trimester labs Patient is requesting a maternity support belt to help her with back pain Patient desires BTL- form signed today Patient meeting with genetic counselor later this afternoon regarding SMN carrier  Preterm labor symptoms and general obstetric precautions including but not limited to vaginal bleeding, contractions, leaking of  fluid and fetal movement were reviewed in detail with the patient. Please refer to After Visit Summary for other counseling recommendations.   Return in about 2 weeks (around 02/05/2021) for in person, ROB, Low risk.  Future Appointments  Date Time Provider [place]  01/22/2021  1:00 PM [facility] GENETIC COUNSELING RM [facility]    Mora Bellman, MD

## 2021-01-23 ENCOUNTER — Other Ambulatory Visit: Payer: Medicaid Other

## 2021-02-05 ENCOUNTER — Other Ambulatory Visit: Payer: Self-pay

## 2021-02-05 ENCOUNTER — Other Ambulatory Visit: Payer: Medicaid Other

## 2021-02-05 ENCOUNTER — Ambulatory Visit (INDEPENDENT_AMBULATORY_CARE_PROVIDER_SITE_OTHER): Payer: Medicaid Other | Admitting: Obstetrics

## 2021-02-05 ENCOUNTER — Encounter: Payer: Self-pay | Admitting: Obstetrics

## 2021-02-05 VITALS — BP 132/70 | HR 94 | Wt 168.8 lb

## 2021-02-05 DIAGNOSIS — Z23 Encounter for immunization: Secondary | ICD-10-CM

## 2021-02-05 DIAGNOSIS — Z3480 Encounter for supervision of other normal pregnancy, unspecified trimester: Secondary | ICD-10-CM

## 2021-02-05 DIAGNOSIS — Z3483 Encounter for supervision of other normal pregnancy, third trimester: Secondary | ICD-10-CM

## 2021-02-05 DIAGNOSIS — Z3A29 29 weeks gestation of pregnancy: Secondary | ICD-10-CM

## 2021-02-05 NOTE — Progress Notes (Signed)
Subjective:  Cosette Prindle is a 31 y.o. G5P3013 at [redacted]w[redacted]d being seen today for ongoing prenatal care.  She is currently monitored for the following issues for this low-risk pregnancy and has Supervision of normal pregnancy, antepartum and [redacted] weeks gestation of pregnancy on their problem list.  Patient reports heartburn.  Contractions: Not present. Vag. Bleeding: None.  Movement: Present. Denies leaking of fluid.   The following portions of the patient's history were reviewed and updated as appropriate: allergies, current medications, past family history, past medical history, past social history, past surgical history and problem list. Problem list updated.  Objective:   Vitals:   02/05/21 0842  Weight: 168 lb 12.8 oz (76.6 kg)    Fetal Status:     Movement: Present     General:  Alert, oriented and cooperative. Patient is in no acute distress.  Skin: Skin is warm and dry. No rash noted.   Cardiovascular: Normal heart rate noted  Respiratory: Normal respiratory effort, no problems with respiration noted  Abdomen: Soft, gravid, appropriate for gestational age. Pain/Pressure: Absent     Pelvic:  Cervical exam deferred        Extremities: Normal range of motion.  Edema: None  Mental Status: Normal mood and affect. Normal behavior. Normal judgment and thought content.   Urinalysis:      Assessment and Plan:  Pregnancy: [id number] at [redacted]w[redacted]d  1. Supervision of other normal pregnancy, antepartum Rx: - Glucose Tolerance, 2 Hours w/1 Hour - CBC - HIV Antibody (routine testing w rflx) - RPR - Tdap vaccine greater than or equal to 7yo IM  Preterm labor symptoms and general obstetric precautions including but not limited to vaginal bleeding, contractions, leaking of fluid and fetal movement were reviewed in detail with the patient. Please refer to After Visit Summary for other counseling recommendations.   Return in about 2 weeks (around 02/19/2021) for ROB.    Brock Bad,  MD 02/05/2021 8:54 AM

## 2021-02-05 NOTE — Progress Notes (Signed)
Patient presents for ROB and GTT. Patient would like to receive TDAP today.

## 2021-02-06 LAB — RPR: RPR Ser Ql: NONREACTIVE

## 2021-02-06 LAB — CBC
Hematocrit: 35.2 % (ref 34.0–46.6)
Hemoglobin: 11.8 g/dL (ref 11.1–15.9)
MCH: 30.4 pg (ref 26.6–33.0)
MCHC: 33.5 g/dL (ref 31.5–35.7)
MCV: 91 fL (ref 79–97)
Platelets: 204 10*3/uL (ref 150–450)
RBC: 3.88 x10E6/uL (ref 3.77–5.28)
RDW: 13.2 % (ref 11.7–15.4)
WBC: 13.9 10*3/uL — ABNORMAL HIGH (ref 3.4–10.8)

## 2021-02-06 LAB — GLUCOSE TOLERANCE, 2 HOURS W/ 1HR
Glucose, 1 hour: 166 mg/dL (ref 70–179)
Glucose, 2 hour: 148 mg/dL (ref 70–152)
Glucose, Fasting: 82 mg/dL (ref 70–91)

## 2021-02-06 LAB — HIV ANTIBODY (ROUTINE TESTING W REFLEX): HIV Screen 4th Generation wRfx: NONREACTIVE

## 2021-02-13 ENCOUNTER — Encounter: Payer: Self-pay | Admitting: Obstetrics and Gynecology

## 2021-02-16 ENCOUNTER — Other Ambulatory Visit: Payer: Self-pay

## 2021-02-16 MED ORDER — DOXYLAMINE-PYRIDOXINE 10-10 MG PO TBEC: 10.0000 mg | DELAYED_RELEASE_TABLET | Freq: Every day | ORAL | 2 refills | Status: DC | PRN

## 2021-02-19 ENCOUNTER — Encounter: Payer: Self-pay | Admitting: Obstetrics

## 2021-02-19 ENCOUNTER — Ambulatory Visit (INDEPENDENT_AMBULATORY_CARE_PROVIDER_SITE_OTHER): Payer: Medicaid Other | Admitting: Obstetrics

## 2021-02-19 ENCOUNTER — Other Ambulatory Visit: Payer: Self-pay

## 2021-02-19 DIAGNOSIS — Z3480 Encounter for supervision of other normal pregnancy, unspecified trimester: Secondary | ICD-10-CM

## 2021-02-19 NOTE — Progress Notes (Signed)
ROB c/o pressure. 

## 2021-02-19 NOTE — Progress Notes (Signed)
Reports some UC's and pressure down low. UC's do not last long or become stronger or more regular. Concerned for preterm labor. Reports early deliveries but no deliveries before 38 weeks. Reports "my cervix is thin" but refers to previous pregnancies not current.

## 2021-02-19 NOTE — Progress Notes (Signed)
Subjective:  Kathleen Chapman is a 31 y.o. [id number] at [redacted]w[redacted]d being seen today for ongoing prenatal care.  She is currently monitored for the following issues for this low-risk pregnancy and has Supervision of normal pregnancy, antepartum and [redacted] weeks gestation of pregnancy on their problem list.  Patient reports  pelvic pressure .  Contractions: Irritability.  .  Movement: Present. Denies leaking of fluid.   The following portions of the patient's history were reviewed and updated as appropriate: allergies, current medications, past family history, past medical history, past social history, past surgical history and problem list. Problem list updated.  Objective:   Vitals:   02/19/21 0858  BP: 128/71  Pulse: 92  Weight: 169 lb (76.7 kg)    Fetal Status: Fetal Heart Rate (bpm): 148   Movement: Present     General:  Alert, oriented and cooperative. Patient is in no acute distress.  Skin: Skin is warm and dry. No rash noted.   Cardiovascular: Normal heart rate noted  Respiratory: Normal respiratory effort, no problems with respiration noted  Abdomen: Soft, gravid, appropriate for gestational age. Pain/Pressure: Present     Pelvic:  Cervical exam deferred        Extremities: Normal range of motion.  Edema: None  Mental Status: Normal mood and affect. Normal behavior. Normal judgment and thought content.   Urinalysis:      Assessment and Plan:  Pregnancy: [id number] at [redacted]w[redacted]d  1. Supervision of other normal pregnancy, antepartum   Preterm labor symptoms and general obstetric precautions including but not limited to vaginal bleeding, contractions, leaking of fluid and fetal movement were reviewed in detail with the patient. Please refer to After Visit Summary for other counseling recommendations.   Return in about 2 weeks (around 03/05/2021) for ROB.   Shelly Bombard, MD  02/19/21

## 2021-03-05 ENCOUNTER — Ambulatory Visit (INDEPENDENT_AMBULATORY_CARE_PROVIDER_SITE_OTHER): Payer: Medicaid Other | Admitting: Obstetrics

## 2021-03-05 ENCOUNTER — Other Ambulatory Visit: Payer: Self-pay

## 2021-03-05 ENCOUNTER — Encounter: Payer: Self-pay | Admitting: Obstetrics

## 2021-03-05 VITALS — BP 133/76 | HR 123 | Wt 171.0 lb

## 2021-03-05 DIAGNOSIS — R350 Frequency of micturition: Secondary | ICD-10-CM

## 2021-03-05 DIAGNOSIS — Z3480 Encounter for supervision of other normal pregnancy, unspecified trimester: Secondary | ICD-10-CM

## 2021-03-05 DIAGNOSIS — Z148 Genetic carrier of other disease: Secondary | ICD-10-CM

## 2021-03-05 LAB — POCT URINALYSIS DIPSTICK
Bilirubin, UA: POSITIVE
Blood, UA: NEGATIVE
Glucose, UA: NEGATIVE
Ketones, UA: NEGATIVE
Nitrite, UA: NEGATIVE
Protein, UA: POSITIVE — AB
Spec Grav, UA: 1.025 (ref 1.010–1.025)
Urobilinogen, UA: 0.2 E.U./dL
pH, UA: 5 (ref 5.0–8.0)

## 2021-03-05 NOTE — Progress Notes (Signed)
ROB c/o urinary frequency. ?

## 2021-03-05 NOTE — Progress Notes (Signed)
Subjective:  ?Kathleen Chapman is a 31 y.o. [id number] at [redacted]w[redacted]d being seen today for ongoing prenatal care.  She is currently monitored for the following issues for this low-risk pregnancy and has Supervision of normal pregnancy, antepartum and [redacted] weeks gestation of pregnancy on their problem list. ? ?Patient reports  urinary frequency .  Contractions: Not present. Vag. Bleeding: None.  Movement: Present. Denies leaking of fluid.  ? ?The following portions of the patient's history were reviewed and updated as appropriate: allergies, current medications, past family history, past medical history, past social history, past surgical history and problem list. Problem list updated. ? ?Objective:  ? ?Vitals:  ? 03/05/21 0827  ?BP: 133/76  ?Pulse: (!) 123  ?Weight: 171 lb (77.6 kg)  ? ? ?Fetal Status: Fetal Heart Rate (bpm): 136   Movement: Present    ? ?General:  Alert, oriented and cooperative. Patient is in no acute distress.  ?Skin: Skin is warm and dry. No rash noted.   ?Cardiovascular: Normal heart rate noted  ?Respiratory: Normal respiratory effort, no problems with respiration noted  ?Abdomen: Soft, gravid, appropriate for gestational age. Pain/Pressure: Present     ?Pelvic:  Cervical exam deferred        ?Extremities: Normal range of motion.  Edema: None  ?Mental Status: Normal mood and affect. Normal behavior. Normal judgment and thought content.  ? ?Urinalysis:     ? ?Assessment and Plan:  ?Pregnancy: [id number] at [redacted]w[redacted]d ? ?1. Supervision of other normal pregnancy, antepartum ? ?2. Carrier of spinal muscular atrophy ? ? ?Preterm labor symptoms and general obstetric precautions including but not limited to vaginal bleeding, contractions, leaking of fluid and fetal movement were reviewed in detail with the patient. ?Please refer to After Visit Summary for other counseling recommendations.  ? ?Return in about 2 weeks (around 03/19/2021) for ROB. ? ? ?Brock Bad, MD  ?03/05/21  ?

## 2021-03-06 ENCOUNTER — Ambulatory Visit: Payer: Medicaid Other | Admitting: *Deleted

## 2021-03-06 ENCOUNTER — Other Ambulatory Visit: Payer: Self-pay | Admitting: *Deleted

## 2021-03-06 ENCOUNTER — Ambulatory Visit: Payer: Medicaid Other | Attending: Obstetrics

## 2021-03-06 VITALS — BP 120/73 | HR 102

## 2021-03-06 DIAGNOSIS — Z3A33 33 weeks gestation of pregnancy: Secondary | ICD-10-CM | POA: Insufficient documentation

## 2021-03-06 DIAGNOSIS — Z3493 Encounter for supervision of normal pregnancy, unspecified, third trimester: Secondary | ICD-10-CM

## 2021-03-06 DIAGNOSIS — Z3687 Encounter for antenatal screening for uncertain dates: Secondary | ICD-10-CM | POA: Diagnosis present

## 2021-03-06 DIAGNOSIS — O26843 Uterine size-date discrepancy, third trimester: Secondary | ICD-10-CM | POA: Diagnosis not present

## 2021-03-08 LAB — URINE CULTURE

## 2021-03-13 ENCOUNTER — Encounter (HOSPITAL_COMMUNITY): Payer: Self-pay | Admitting: Obstetrics and Gynecology

## 2021-03-13 ENCOUNTER — Other Ambulatory Visit: Payer: Self-pay

## 2021-03-13 ENCOUNTER — Inpatient Hospital Stay (HOSPITAL_COMMUNITY)
Admission: AD | Admit: 2021-03-13 | Discharge: 2021-03-13 | Disposition: A | Discharge status: Home / Self Care | Payer: Medicaid Other | Attending: Obstetrics and Gynecology | Admitting: Obstetrics and Gynecology

## 2021-03-13 DIAGNOSIS — Z0371 Encounter for suspected problem with amniotic cavity and membrane ruled out: Secondary | ICD-10-CM

## 2021-03-13 DIAGNOSIS — Z3A34 34 weeks gestation of pregnancy: Secondary | ICD-10-CM | POA: Insufficient documentation

## 2021-03-13 DIAGNOSIS — O4703 False labor before 37 completed weeks of gestation, third trimester: Secondary | ICD-10-CM | POA: Insufficient documentation

## 2021-03-13 LAB — GROUP B STREP BY PCR: Group B strep by PCR: NEGATIVE

## 2021-03-13 LAB — URINALYSIS, ROUTINE W REFLEX MICROSCOPIC
Bilirubin Urine: NEGATIVE
Glucose, UA: NEGATIVE mg/dL
Hgb urine dipstick: NEGATIVE
Ketones, ur: 20 mg/dL — AB
Leukocytes,Ua: NEGATIVE
Nitrite: NEGATIVE
Protein, ur: NEGATIVE mg/dL
Specific Gravity, Urine: 1.004 — ABNORMAL LOW (ref 1.005–1.030)
pH: 7 (ref 5.0–8.0)

## 2021-03-13 MED ORDER — LACTATED RINGERS IV BOLUS
1000.0000 mL | Freq: Once | INTRAVENOUS | Status: AC
  Administered 2021-03-13: 1000 mL via INTRAVENOUS

## 2021-03-13 MED ORDER — NIFEDIPINE 10 MG PO CAPS
10.0000 mg | ORAL_CAPSULE | ORAL | Status: DC | PRN
  Administered 2021-03-13 (×2): 10 mg via ORAL
  Filled 2021-03-13 (×3): qty 1

## 2021-03-13 NOTE — MAU Note (Signed)
Patient Access called 2018. Pt was brought back to the room  MAU provider, Manya Silvas, CNM at bedside 2019. Pt reported she felt like she had to have a bowel movement. She states having a lot of pressure, and she went to wipe and felt a bulge and LOF with mucus. No VB.  ?FHR 140 @ 2021  ?SVE performed @ 2022: 4 cm/70/-1/Bulging Bag of Water  ?VS performed @ 2028 ?Pt OB prenatal at [facility] ?MAU Provider placing orders for Preterm Labor.  ?

## 2021-03-13 NOTE — MAU Provider Note (Signed)
CC:  ?Chief Complaint  ?Patient presents with  ? Contractions  ? ? Event Date/Time  ? First Provider Initiated Contact with Patient 03/13/21 2353   ?   ? ?HPI: Kathleen Chapman is a 31 y.o. year old [id number] female at [redacted]w[redacted]d weeks gestation who presents to MAU reporting pelvic pressure similar to when she has been in labor in previous pregnancies. The she needed to have a Bowel Mvmt. Was sitting in the toilet and felt something leak out of her vagina. Unsure if she is having contractions but reports frequent abdominal tightening. Unsure if she is leaking amniotic fluid.  ? ?Associated Sx:  ?Vaginal bleeding: Denies ?Leaking of fluid: Possible ?Fetal movement: Nml ? ?O: Patient Vitals for the past 24 hrs: ? BP Temp Temp src Pulse Resp SpO2  ?03/13/21 2134 (!) 111/47 -- -- (!) 113 -- --  ?03/13/21 2115 (!) 120/57 -- -- (!) 111 -- --  ?03/13/21 2051 131/67 -- -- (!) 110 -- --  ?03/13/21 2028 139/84 98.1 ?F (36.7 ?C) Oral (!) 120 20 100 %  ? ? ?General: NAD ?Heart: mild tachycardia.  ?Lungs: Normal rate and effort ?Abd: Soft, NT, Gravid, S=D ?Pelvic: NEFG, Neg pooling, Neg blood.  ?Dilation: 4 ?Effacement (%): 70 ?Cervical Position: Middle ?Station: -1 ?Presentation: Vertex ?Exam by:: Dorathy Kinsman, CNM ?BOW palpated.  ? ?EFM: 140, Moderate variability, 15 x 15 accelerations, no decelerations ?Toco: Contractions every irreg w/ UI, mild ? ?Orders Placed This Encounter  ?Procedures  ? Group B strep by PCR  ? Urinalysis, Routine w reflex microscopic Urine, Clean Catch  ? Insert peripheral IV  ? Discharge patient  ? ?Meds ordered this encounter  ?Medications  ? NIFEdipine (PROCARDIA) capsule 10 mg  ? lactated ringers bolus 1,000 mL  ? ?MDM ?Preterm labor w/out cervical change over almost three hours. Pelvic pressure resolved after Fluid bolus and Procardia. [place] Nml. Pt anxious to go home. PTL precautions reviewed. Pt instructed on how to palpate contractions since she doesn't feel them in her abd/pelvis. Discussed Hx, labs,  exam w/ Dr. Alysia Penna. Agrees w/ POC. Does not recommend Betamethasone.  ? ? ?A: [redacted]w[redacted]d week IUP ?Preterm labor ?FHR reactive ? ?P: Discharge home in stable condition per consult with Hermina Staggers, MD. ?Preterm labor precautions and fetal kick counts. ?Increase fluids and rest ?Follow-up as scheduled for prenatal visit or sooner as needed if symptoms worsen. ?Return to maternity admissions as needed if symptoms worsen. ? ?Katrinka Blazing, [place], CNM ?03/13/2021 ?11:48 PM ? ?3 ? ? ? ? ?

## 2021-03-13 NOTE — Progress Notes (Signed)
RN reviewed discharge instructions with pt; pt verbalized understanding. Pt signed discharge documentation.  ?

## 2021-03-19 ENCOUNTER — Encounter: Payer: Self-pay | Admitting: Obstetrics

## 2021-03-19 ENCOUNTER — Ambulatory Visit (INDEPENDENT_AMBULATORY_CARE_PROVIDER_SITE_OTHER): Payer: Medicaid Other | Admitting: Obstetrics

## 2021-03-19 ENCOUNTER — Other Ambulatory Visit: Payer: Self-pay

## 2021-03-19 VITALS — BP 121/77 | HR 103 | Wt 175.0 lb

## 2021-03-19 NOTE — Progress Notes (Signed)
ROB 35.[redacted]wks GA ?Had MAU visit PTL, 4 cms, given Procardia ?Request SVE and to discuss BMZ ?

## 2021-03-19 NOTE — Progress Notes (Signed)
Subjective:  ?Kathleen Chapman is a 31 y.o. [id number] at [redacted]w[redacted]d being seen today for ongoing prenatal care.  She is currently monitored for the following issues for this high-risk pregnancy. ? ?Patient reports occasional contractions and pelvic pressure .  Contractions: Irritability. Vag. Bleeding: None.  Movement: Present. Denies leaking of fluid.  ? ?The following portions of the patient's history were reviewed and updated as appropriate: allergies, current medications, past family history, past medical history, past social history, past surgical history and problem list. Problem list updated. ? ?Objective:  ? ?Vitals:  ? 03/19/21 0837  ?BP: 121/77  ?Pulse: (!) 103  ?Weight: 175 lb (79.4 kg)  ? ? ?Fetal Status: Fetal Heart Rate (bpm): 145   Movement: Present    ? ?General:  Alert, oriented and cooperative. Patient is in no acute distress.  ?Skin: Skin is warm and dry. No rash noted.   ?Cardiovascular: Normal heart rate noted  ?Respiratory: Normal respiratory effort, no problems with respiration noted  ?Abdomen: Soft, gravid, appropriate for gestational age. Pain/Pressure: Present     ?Pelvic:  Cervical exam performed Dilation: 4 Effacement (%): 60 Station: -2  Presentation Vtx  ?Extremities: Normal range of motion.  Edema: None  ?Mental Status: Normal mood and affect. Normal behavior. Normal judgment and thought content.  ? ?Urinalysis:     ? ?Assessment and Plan:  ?Pregnancy: [id number] at [redacted]w[redacted]d ? ?1. Supervision of high risk pregnancy, antepartum ? ?2. Decreased fetal growth with EFW at 15th percentile with normal AFV and good fetal movement.   Repeat U/S for interval growth scheduled per MFM in 4 weeks ? ?3. Preterm labor ?- clinically stable with unchanged cervical exam of a multiparous cervix ?- patient given PTL precautions and instructed to rest more, including pelvic rest and no sexual stimulation ? ?  ?There are no diagnoses linked to this encounter. ?Preterm labor symptoms and general obstetric precautions  including but not limited to vaginal bleeding, contractions, leaking of fluid and fetal movement were reviewed in detail with the patient. ?Please refer to After Visit Summary for other counseling recommendations.  ? ?Return in about 1 week (around 03/26/2021) for [facility]. ? ? ?Shelly Bombard, MD  ?03/19/21  ?

## 2021-03-24 ENCOUNTER — Other Ambulatory Visit: Payer: Self-pay

## 2021-03-24 ENCOUNTER — Encounter (HOSPITAL_COMMUNITY): Payer: Self-pay | Admitting: Obstetrics & Gynecology

## 2021-03-24 ENCOUNTER — Inpatient Hospital Stay (HOSPITAL_COMMUNITY)
Admission: AD | Admit: 2021-03-24 | Discharge: 2021-03-24 | Disposition: A | Discharge status: Home / Self Care | Payer: Medicaid Other | Attending: Obstetrics & Gynecology | Admitting: Obstetrics & Gynecology

## 2021-03-24 DIAGNOSIS — O368330 Maternal care for abnormalities of the fetal heart rate or rhythm, third trimester, not applicable or unspecified: Secondary | ICD-10-CM | POA: Insufficient documentation

## 2021-03-24 DIAGNOSIS — O4703 False labor before 37 completed weeks of gestation, third trimester: Secondary | ICD-10-CM | POA: Diagnosis not present

## 2021-03-24 DIAGNOSIS — O26893 Other specified pregnancy related conditions, third trimester: Secondary | ICD-10-CM | POA: Insufficient documentation

## 2021-03-24 DIAGNOSIS — Z3A36 36 weeks gestation of pregnancy: Secondary | ICD-10-CM | POA: Diagnosis not present

## 2021-03-24 DIAGNOSIS — O479 False labor, unspecified: Secondary | ICD-10-CM

## 2021-03-24 NOTE — MAU Note (Signed)
.  Kathleen Chapman is a 31 y.o. at [redacted]w[redacted]d here in MAU reporting: ctxs that began this morning @ 0900, states ctxs are 5-6 minutes apart. ? ?Onset of complaint: today @ 0900 ?Pain score: 7 ?Vitals:  ? 03/24/21 1040  ?BP: 116/79  ?Pulse: (!) 102  ?Resp: 19  ?Temp: 97.9 ?F (36.6 ?C)  ?SpO2: 100%  ?   ?FHT:148 bpm w/ +FM.  Denies VB or LOF ?Lab orders placed from triage:   None ?

## 2021-03-24 NOTE — MAU Note (Signed)
Patient states "I can't feel my CTX the only way I know it's CTX is because my stomach gets tight. Only three of them have been a little painful. I just want to be checked. I have been 4 cm for two weeks. I want to know if I'm in active labor." ?

## 2021-03-24 NOTE — MAU Note (Signed)
Patient signed printed AVS. Placed in chart. 

## 2021-03-24 NOTE — MAU Provider Note (Signed)
MAU Provider Note  ? ?S: Ms. Kathleen Chapman is a 31 y.o. [phone] at [redacted]w[redacted]d  who presents to MAU today complaining contractions q 5-6 minutes. She denies vaginal bleeding. She denies LOF. She reports normal fetal movement.   ? ?O: BP 130/77 (BP Location: Right Arm)   Pulse (!) 108   Temp 97.9 ?F (36.6 ?C) (Oral)   Resp 19   Ht 5' (1.524 m)   Wt 79.3 kg   LMP 07/15/2020   SpO2 100%   BMI 34.16 kg/m?  ?GENERAL: Well-developed, well-nourished female in no acute distress.  ?HEAD: Normocephalic, atraumatic.  ?CHEST: Normal effort of breathing, regular heart rate ?ABDOMEN: Soft, nontender, gravid ? ?Cervical exam:  ?Dilation: 3.5 ?Effacement (%): 50 ?Cervical Position: Posterior ?Station: -3 ?Presentation: Vertex ?Exam by:: Erle Crocker, RN ?Unchanged from clinic checks  ? ?Fetal Monitoring: ?Baseline: 150 ?Variability: mod ?Accelerations: 15x15 ?Decelerations: none ?Contractions: occasional  ? ? ?A: ?SIUP at [redacted]w[redacted]d  ?False labor ?Reactive NST  ? ?P: ?Discharge home in stable condition. MAU precautions discussed.  ? ? ?Allayne Stack, DO ?03/24/2021 11:44 AM  ?

## 2021-03-26 ENCOUNTER — Encounter: Payer: Self-pay | Admitting: Obstetrics

## 2021-03-26 ENCOUNTER — Other Ambulatory Visit: Payer: Self-pay

## 2021-03-26 ENCOUNTER — Ambulatory Visit (INDEPENDENT_AMBULATORY_CARE_PROVIDER_SITE_OTHER): Payer: Medicaid Other | Admitting: Obstetrics

## 2021-03-26 ENCOUNTER — Other Ambulatory Visit (HOSPITAL_COMMUNITY)
Admission: RE | Admit: 2021-03-26 | Discharge: 2021-03-26 | Disposition: A | Discharge status: Home / Self Care | Payer: Medicaid Other | Source: Ambulatory Visit | Attending: Obstetrics | Admitting: Obstetrics

## 2021-03-26 VITALS — BP 123/83 | HR 97 | Wt 176.7 lb

## 2021-03-26 DIAGNOSIS — Z3A36 36 weeks gestation of pregnancy: Secondary | ICD-10-CM | POA: Diagnosis not present

## 2021-03-26 DIAGNOSIS — Z3483 Encounter for supervision of other normal pregnancy, third trimester: Secondary | ICD-10-CM | POA: Diagnosis present

## 2021-03-26 DIAGNOSIS — Z3480 Encounter for supervision of other normal pregnancy, unspecified trimester: Secondary | ICD-10-CM

## 2021-03-26 NOTE — Progress Notes (Signed)
Pt in office for [place] visit. Pt c/o of a lot of pressure but has no other complaints today.  ? ?GBS colleted: 03-13-21 at MAU ?

## 2021-03-26 NOTE — Progress Notes (Signed)
Subjective:  ?Kathleen Chapman is a 31 y.o. [id number] at [redacted]w[redacted]d being seen today for ongoing prenatal care.  She is currently monitored for the following issues for this low-risk pregnancy and has Supervision of normal pregnancy, antepartum and [redacted] weeks gestation of pregnancy on their problem list. ? ?Patient reports occasional contractions and pelvic pressure .  Contractions: Irritability. Vag. Bleeding: None.  Movement: Present. Denies leaking of fluid.  ? ?The following portions of the patient's history were reviewed and updated as appropriate: allergies, current medications, past family history, past medical history, past social history, past surgical history and problem list. Problem list updated. ? ?Objective:  ? ?Vitals:  ? 03/26/21 0841  ?BP: 123/83  ?Pulse: 97  ?Weight: 176 lb 11.2 oz (80.2 kg)  ? ? ?Fetal Status: Fetal Heart Rate (bpm): 134    Movement: Present    ? ?General:  Alert, oriented and cooperative. Patient is in no acute distress.  ?Skin: Skin is warm and dry. No rash noted.   ?Cardiovascular: Normal heart rate noted  ?Respiratory: Normal respiratory effort, no problems with respiration noted  ?Abdomen: Soft, gravid, appropriate for gestational age. Pain/Pressure: Present     ?Pelvic:  Cervical exam deferred        ?Extremities: Normal range of motion.  Edema: None  ?Mental Status: Normal mood and affect. Normal behavior. Normal judgment and thought content.  ? ?Urinalysis:     ? ?Assessment and Plan:  ?Pregnancy: [id number] at [redacted]w[redacted]d ? ?1. Supervision of other normal pregnancy, antepartum ?Rx: ?- Cervicovaginal ancillary only( [place]) ? ?Preterm labor symptoms and general obstetric precautions including but not limited to vaginal bleeding, contractions, leaking of fluid and fetal movement were reviewed in detail with the patient. ?Please refer to After Visit Summary for other counseling recommendations.  ? ?Return in about 1 week (around 04/02/2021) for ROB. ? ? ?Brock Bad, MD  ?03/26/21   ?

## 2021-03-27 LAB — CERVICOVAGINAL ANCILLARY ONLY
Bacterial Vaginitis (gardnerella): NEGATIVE
Candida Glabrata: NEGATIVE
Candida Vaginitis: NEGATIVE
Chlamydia: NEGATIVE
Comment: NEGATIVE
Comment: NEGATIVE
Comment: NEGATIVE
Comment: NEGATIVE
Comment: NEGATIVE
Comment: NORMAL
Neisseria Gonorrhea: NEGATIVE
Trichomonas: NEGATIVE

## 2021-04-01 ENCOUNTER — Ambulatory Visit (INDEPENDENT_AMBULATORY_CARE_PROVIDER_SITE_OTHER): Payer: Medicaid Other | Admitting: Obstetrics and Gynecology

## 2021-04-01 ENCOUNTER — Other Ambulatory Visit: Payer: Self-pay

## 2021-04-01 VITALS — BP 132/83 | HR 101 | Wt 179.0 lb

## 2021-04-01 DIAGNOSIS — Z3A37 37 weeks gestation of pregnancy: Secondary | ICD-10-CM

## 2021-04-01 DIAGNOSIS — Z3480 Encounter for supervision of other normal pregnancy, unspecified trimester: Secondary | ICD-10-CM

## 2021-04-01 NOTE — Progress Notes (Signed)
Pt would like cervix check today.  

## 2021-04-01 NOTE — Progress Notes (Signed)
? ?  PRENATAL VISIT NOTE ? ?Subjective:  ?Kathleen Chapman is a 31 y.o. G2I9485 at [redacted]w[redacted]d being seen today for ongoing prenatal care.  She is currently monitored for the following issues for this low-risk pregnancy and has Supervision of normal pregnancy, antepartum and [redacted] weeks gestation of pregnancy on their problem list. ? ?Patient doing well with no acute concerns today. She reports occasional contractions.  Contractions: Irritability. Vag. Bleeding: None.  Movement: Present. Denies leaking of fluid.  ? ?The following portions of the patient's history were reviewed and updated as appropriate: allergies, current medications, past family history, past medical history, past social history, past surgical history and problem list. Problem list updated. ? ?Objective:  ? ?Vitals:  ? 04/01/21 0903  ?BP: 132/83  ?Pulse: (!) 101  ?Weight: 179 lb (81.2 kg)  ? ? ?Fetal Status: Fetal Heart Rate (bpm): 140 Fundal Height: 38 cm Movement: Present  Presentation: Vertex ? ?General:  Alert, oriented and cooperative. Patient is in no acute distress.  ?Skin: Skin is warm and dry. No rash noted.   ?Cardiovascular: Normal heart rate noted  ?Respiratory: Normal respiratory effort, no problems with respiration noted  ?Abdomen: Soft, gravid, appropriate for gestational age.  Pain/Pressure: Present     ?Pelvic: Cervical exam performed Dilation: 4 Effacement (%): 60 Station: -1  ?Extremities: Normal range of motion.     ?Mental Status:  Normal mood and affect. Normal behavior. Normal judgment and thought content.  ? ?Assessment and Plan:  ?Pregnancy: [id number] at [redacted]w[redacted]d ? ?1. [redacted] weeks gestation of pregnancy ? ? ?2. Supervision of other normal pregnancy, antepartum ?Pt is 4 cm dilated with occasional ctx, will schedule elective IOL at 39 weeks, orders placed ? ?Term labor symptoms and general obstetric precautions including but not limited to vaginal bleeding, contractions, leaking of fluid and fetal movement were reviewed in detail with the  patient. ? ?Please refer to After Visit Summary for other counseling recommendations.  ? ?Return in about 1 week (around 04/08/2021) for ROB, in person. ? ? ?Mariel Aloe, MD ?Faculty Attending ?Center for [facility] ?  ?

## 2021-04-02 ENCOUNTER — Telehealth (HOSPITAL_COMMUNITY): Payer: Self-pay | Admitting: *Deleted

## 2021-04-02 ENCOUNTER — Encounter (HOSPITAL_COMMUNITY): Payer: Self-pay | Admitting: *Deleted

## 2021-04-02 NOTE — Telephone Encounter (Signed)
Preadmission screen  

## 2021-04-03 ENCOUNTER — Encounter: Payer: Self-pay | Admitting: *Deleted

## 2021-04-03 ENCOUNTER — Ambulatory Visit: Payer: Medicaid Other | Admitting: *Deleted

## 2021-04-03 ENCOUNTER — Ambulatory Visit: Payer: Medicaid Other | Attending: Maternal & Fetal Medicine

## 2021-04-03 VITALS — BP 139/70 | HR 105

## 2021-04-03 DIAGNOSIS — Z3A37 37 weeks gestation of pregnancy: Secondary | ICD-10-CM | POA: Insufficient documentation

## 2021-04-03 DIAGNOSIS — Z3689 Encounter for other specified antenatal screening: Secondary | ICD-10-CM

## 2021-04-03 DIAGNOSIS — O26843 Uterine size-date discrepancy, third trimester: Secondary | ICD-10-CM | POA: Insufficient documentation

## 2021-04-03 DIAGNOSIS — Z3493 Encounter for supervision of normal pregnancy, unspecified, third trimester: Secondary | ICD-10-CM

## 2021-04-08 ENCOUNTER — Other Ambulatory Visit: Payer: Self-pay | Admitting: Advanced Practice Midwife

## 2021-04-09 ENCOUNTER — Encounter: Payer: Self-pay | Admitting: Obstetrics and Gynecology

## 2021-04-09 ENCOUNTER — Ambulatory Visit (INDEPENDENT_AMBULATORY_CARE_PROVIDER_SITE_OTHER): Payer: Medicaid Other | Admitting: Obstetrics and Gynecology

## 2021-04-09 VITALS — BP 131/83 | HR 102 | Wt 178.4 lb

## 2021-04-09 DIAGNOSIS — Z3480 Encounter for supervision of other normal pregnancy, unspecified trimester: Secondary | ICD-10-CM

## 2021-04-09 NOTE — Progress Notes (Signed)
? ?  PRENATAL VISIT NOTE ? ?Subjective:  ?Kathleen Chapman is a 31 y.o. [id number] at [redacted]w[redacted]d being seen today for ongoing prenatal care.  She is currently monitored for the following issues for this low-risk pregnancy and has Supervision of normal pregnancy, antepartum on their problem list. ? ?Patient reports generalized body pruritis.  Contractions: Irritability. Vag. Bleeding: None.  Movement: Present. Denies leaking of fluid.  ? ?The following portions of the patient's history were reviewed and updated as appropriate: allergies, current medications, past family history, past medical history, past social history, past surgical history and problem list.  ? ?Objective:  ? ?Vitals:  ? 04/09/21 0856  ?BP: 131/83  ?Pulse: (!) 102  ?Weight: 178 lb 6.4 oz (80.9 kg)  ? ? ?Fetal Status: Fetal Heart Rate (bpm): 138 Fundal Height: 38 cm Movement: Present    ? ?General:  Alert, oriented and cooperative. Patient is in no acute distress.  ?Skin: Skin is warm and dry. No rash noted.   ?Cardiovascular: Normal heart rate noted  ?Respiratory: Normal respiratory effort, no problems with respiration noted  ?Abdomen: Soft, gravid, appropriate for gestational age.  Pain/Pressure: Present     ?Pelvic: Cervical exam deferred        ?Extremities: Normal range of motion.  Edema: Trace  ?Mental Status: Normal mood and affect. Normal behavior. Normal judgment and thought content.  ? ?Assessment and Plan:  ?Pregnancy: [id number] at [redacted]w[redacted]d ?1. Supervision of other normal pregnancy, antepartum ?Patient is doing well  ?Bile acids today ?Plans for nexplanon ?Scheduled for IOL at 39 weeks pending bile acids ?- Bile acids, total ? ?Term labor symptoms and general obstetric precautions including but not limited to vaginal bleeding, contractions, leaking of fluid and fetal movement were reviewed in detail with the patient. ?Please refer to After Visit Summary for other counseling recommendations.  ? ?Return in about 4 weeks (around 05/07/2021) for  postpartum. ? ?Future Appointments  ?Date Time Provider Department Center  ?04/14/2021  6:30 AM [facility] SCHED ROOM [facility] None  ? ? ?Catalina Antigua, MD ? ?

## 2021-04-11 LAB — BILE ACIDS, TOTAL: Bile Acids Total: 2 umol/L (ref 0.0–10.0)

## 2021-04-12 ENCOUNTER — Other Ambulatory Visit: Payer: Self-pay

## 2021-04-12 ENCOUNTER — Inpatient Hospital Stay (HOSPITAL_COMMUNITY)
Admission: AD | Admit: 2021-04-12 | Discharge: 2021-04-13 | DRG: 807 | Disposition: A | Discharge status: Home / Self Care | Payer: Medicaid Other | Attending: Obstetrics & Gynecology | Admitting: Obstetrics & Gynecology

## 2021-04-12 ENCOUNTER — Encounter (HOSPITAL_COMMUNITY): Payer: Self-pay | Admitting: Obstetrics & Gynecology

## 2021-04-12 DIAGNOSIS — O326XX0 Maternal care for compound presentation, not applicable or unspecified: Secondary | ICD-10-CM | POA: Diagnosis present

## 2021-04-12 DIAGNOSIS — Z30017 Encounter for initial prescription of implantable subdermal contraceptive: Secondary | ICD-10-CM | POA: Diagnosis not present

## 2021-04-12 DIAGNOSIS — Z3490 Encounter for supervision of normal pregnancy, unspecified, unspecified trimester: Secondary | ICD-10-CM | POA: Diagnosis present

## 2021-04-12 DIAGNOSIS — Z3A38 38 weeks gestation of pregnancy: Secondary | ICD-10-CM

## 2021-04-12 DIAGNOSIS — Z87891 Personal history of nicotine dependence: Secondary | ICD-10-CM

## 2021-04-12 DIAGNOSIS — O26893 Other specified pregnancy related conditions, third trimester: Secondary | ICD-10-CM | POA: Diagnosis present

## 2021-04-12 LAB — TYPE AND SCREEN
ABO/RH(D): AB POS
Antibody Screen: NEGATIVE

## 2021-04-12 LAB — CBC
HCT: 35.8 % — ABNORMAL LOW (ref 36.0–46.0)
Hemoglobin: 11.7 g/dL — ABNORMAL LOW (ref 12.0–15.0)
MCH: 28.5 pg (ref 26.0–34.0)
MCHC: 32.7 g/dL (ref 30.0–36.0)
MCV: 87.3 fL (ref 80.0–100.0)
Platelets: 199 10*3/uL (ref 150–400)
RBC: 4.1 MIL/uL (ref 3.87–5.11)
RDW: 14.2 % (ref 11.5–15.5)
WBC: 14.2 10*3/uL — ABNORMAL HIGH (ref 4.0–10.5)
nRBC: 0 % (ref 0.0–0.2)

## 2021-04-12 LAB — RPR: RPR Ser Ql: NONREACTIVE

## 2021-04-12 MED ORDER — BENZOCAINE-MENTHOL 20-0.5 % EX AERO: 1.0000 "application " | INHALATION_SPRAY | CUTANEOUS | Status: DC | PRN

## 2021-04-12 MED ORDER — ONDANSETRON HCL 4 MG/2ML IJ SOLN: 4.0000 mg | INTRAMUSCULAR | Status: DC | PRN

## 2021-04-12 MED ORDER — SOD CITRATE-CITRIC ACID 500-334 MG/5ML PO SOLN
30.0000 mL | ORAL | Status: DC | PRN
Start: 2021-04-12 — End: 2021-04-12

## 2021-04-12 MED ORDER — OXYCODONE HCL 5 MG PO TABS: 10.0000 mg | ORAL_TABLET | ORAL | Status: DC | PRN

## 2021-04-12 MED ORDER — DOCUSATE SODIUM 100 MG PO CAPS
100.0000 mg | ORAL_CAPSULE | Freq: Two times a day (BID) | ORAL | Status: DC
  Administered 2021-04-13 (×2): 100 mg via ORAL
  Filled 2021-04-12 (×2): qty 1

## 2021-04-12 MED ORDER — OXYCODONE-ACETAMINOPHEN 5-325 MG PO TABS: 1.0000 | ORAL_TABLET | ORAL | Status: DC | PRN

## 2021-04-12 MED ORDER — OXYTOCIN-SODIUM CHLORIDE 30-0.9 UT/500ML-% IV SOLN
2.5000 [IU]/h | INTRAVENOUS | Status: DC
  Filled 2021-04-12: qty 500

## 2021-04-12 MED ORDER — LIDOCAINE HCL (PF) 1 % IJ SOLN: 30.0000 mL | INTRAMUSCULAR | Status: DC | PRN

## 2021-04-12 MED ORDER — ONDANSETRON HCL 4 MG PO TABS
4.0000 mg | ORAL_TABLET | ORAL | Status: DC | PRN
Start: 2021-04-12 — End: 2021-04-14

## 2021-04-12 MED ORDER — FENTANYL CITRATE (PF) 100 MCG/2ML IJ SOLN
50.0000 ug | INTRAMUSCULAR | Status: DC | PRN
  Filled 2021-04-12: qty 2

## 2021-04-12 MED ORDER — OXYTOCIN BOLUS FROM INFUSION
333.0000 mL | Freq: Once | INTRAVENOUS | Status: AC
  Administered 2021-04-12: 333 mL via INTRAVENOUS

## 2021-04-12 MED ORDER — LACTATED RINGERS IV SOLN: INTRAVENOUS | Status: DC

## 2021-04-12 MED ORDER — COCONUT OIL OIL: 1.0000 "application " | TOPICAL_OIL | Status: DC | PRN

## 2021-04-12 MED ORDER — SIMETHICONE 80 MG PO CHEW: 80.0000 mg | CHEWABLE_TABLET | ORAL | Status: DC | PRN

## 2021-04-12 MED ORDER — OXYCODONE HCL 5 MG PO TABS: 5.0000 mg | ORAL_TABLET | ORAL | Status: DC | PRN

## 2021-04-12 MED ORDER — TERBUTALINE SULFATE 1 MG/ML IJ SOLN: 0.2500 mg | Freq: Once | INTRAMUSCULAR | Status: DC | PRN

## 2021-04-12 MED ORDER — LACTATED RINGERS IV SOLN: 500.0000 mL | INTRAVENOUS | Status: DC | PRN

## 2021-04-12 MED ORDER — DIBUCAINE (PERIANAL) 1 % EX OINT: 1.0000 "application " | TOPICAL_OINTMENT | CUTANEOUS | Status: DC | PRN

## 2021-04-12 MED ORDER — ONDANSETRON HCL 4 MG/2ML IJ SOLN: 4.0000 mg | Freq: Four times a day (QID) | INTRAMUSCULAR | Status: DC | PRN

## 2021-04-12 MED ORDER — PRENATAL MULTIVITAMIN CH
1.0000 | ORAL_TABLET | Freq: Every day | ORAL | Status: DC
  Administered 2021-04-13: 1 via ORAL
  Filled 2021-04-12: qty 1

## 2021-04-12 MED ORDER — WITCH HAZEL-GLYCERIN EX PADS: 1.0000 "application " | MEDICATED_PAD | CUTANEOUS | Status: DC | PRN

## 2021-04-12 MED ORDER — ACETAMINOPHEN 325 MG PO TABS
650.0000 mg | ORAL_TABLET | ORAL | Status: DC | PRN
  Administered 2021-04-13: 650 mg via ORAL
  Filled 2021-04-12: qty 2

## 2021-04-12 MED ORDER — DIPHENHYDRAMINE HCL 25 MG PO CAPS: 25.0000 mg | ORAL_CAPSULE | Freq: Four times a day (QID) | ORAL | Status: DC | PRN

## 2021-04-12 MED ORDER — IBUPROFEN 600 MG PO TABS
600.0000 mg | ORAL_TABLET | Freq: Four times a day (QID) | ORAL | Status: DC
  Administered 2021-04-12 – 2021-04-13 (×4): 600 mg via ORAL
  Filled 2021-04-12 (×4): qty 1

## 2021-04-12 MED ORDER — OXYTOCIN-SODIUM CHLORIDE 30-0.9 UT/500ML-% IV SOLN: 1.0000 m[IU]/min | INTRAVENOUS | Status: DC

## 2021-04-12 MED ORDER — ACETAMINOPHEN 325 MG PO TABS: 650.0000 mg | ORAL_TABLET | ORAL | Status: DC | PRN

## 2021-04-12 NOTE — Lactation Note (Signed)
This note was copied from a baby's chart. ?Lactation Consultation Note ? ?Patient Name: Kathleen Chapman ?Today's Date: 04/12/2021 ?Reason for consult: L&D Initial assessment;Early term 37-38.6wks ?Age:30 hours ? ?L&D consult with >60 minutes old infant and P4 mother. Congratulated family on newborn.  ?Offered assistance with latch. Mother is experienced, minimal help needed.  ?Observed a deep latch, suckles and swallows.  ? ?Discussed STS as ideal transition for infants after birth. Talked about primal reflexes. Explained LC services availability during postpartum stay. Mother will call LC as needed. Thanked family for their time.   ? ? ?Maternal Data ?Has patient been taught Hand Expression?: Yes ?Does the patient have breastfeeding experience prior to this delivery?: Yes ?How long did the patient breastfeed?: last baby breastfed for 18 months ? ?Feeding ?Mother's Current Feeding Choice: Breast Milk ? ?LATCH Score ?Latch: Grasps breast easily, tongue down, lips flanged, rhythmical sucking. ? ?Audible Swallowing: Spontaneous and intermittent ? ?Type of Nipple: Everted at rest and after stimulation ? ?Comfort (Breast/Nipple): Soft / non-tender ? ?Hold (Positioning): Assistance needed to correctly position infant at breast and maintain latch. ? ?LATCH Score: 9 ? ?Interventions ?Interventions: Breast feeding basics reviewed;Assisted with latch;Skin to skin;Hand express;Breast massage;Expressed milk;Education ? ?Discharge ?Pump: Personal ?WIC Program: No (plans to apply) ? ?Consult Status ?Consult Status: PRN ? ? ? ?Kathleen Chapman ?04/12/2021, 5:39 PM ? ? ? ?

## 2021-04-12 NOTE — MAU Note (Signed)
Christia Domke is a 31 y.o. at [redacted]w[redacted]d here in MAU reporting: ctxs every 3 minutes, began @ 1059 this morning.  Denies VB or LOF.  Endorses +FM. ? ?Onset of complaint: today ?Pain score: 7/10 ?Vitals:  ? 04/12/21 1206  ?BP: 125/81  ?Pulse: 100  ?Resp: 18  ?Temp: 97.6 ?F (36.4 ?C)  ?SpO2: 100%  ?   ?FHT: 134 bpm ?Lab orders placed from triage:    ?

## 2021-04-12 NOTE — Progress Notes (Signed)
Kathleen Chapman is a 31 y.o. [id number] at [redacted]w[redacted]d admitted for labor ? ?Subjective: ?She is comfortable with contractions. Does not want pain meds. ? ?Objective: ?BP 123/78   Pulse 100   Temp 98 ?F (36.7 ?C) (Oral)   Resp 16   Ht 5' (1.524 m)   Wt 82.1 kg   LMP 07/15/2020   SpO2 100%   BMI 35.37 kg/m?  ?No intake/output data recorded. ?No intake/output data recorded. ? ?FHT:  FHR: 145 bpm, variability: moderate,  accelerations:  Present,  decelerations:  Absent ?UC:   regular, every 3-5 minutes ?SVE:   Dilation: 5 ?Effacement (%): 70 ?Station: -1 ?Exam by:: Dr. Macon Large ?AROM for thick meconium ?Labs: ?Lab Results  ?Component Value Chapman  ? WBC 14.2 (H) 04/12/2021  ? HGB 11.7 (L) 04/12/2021  ? HCT 35.8 (L) 04/12/2021  ? MCV 87.3 04/12/2021  ? PLT 199 04/12/2021  ? ? ?Assessment / Plan: ?Spontaneous labor, progressing normally ? ?Labor: Progressing normally, s/p AROM ?Fetal Wellbeing:  Category I ?Pain Control:  Labor support without medications ?I/D:  GBS neg ?Anticipated MOD:  NSVD ? ?Jaynie Collins, MD ?04/12/2021, 3:32 PM ? ? ?

## 2021-04-12 NOTE — Discharge Summary (Signed)
? ?  Postpartum Discharge Summary ? ?   ?Patient Name: Kathleen Chapman ?DOB: 05-18-1990 ?MRN: [id number] ? ?Date of admission: 04/12/2021 ?Delivery date:04/12/2021  ?Delivering provider: Lauretta Chester NILES  ?Date of discharge: 04/13/2021 ? ?Admitting diagnosis: Encounter for elective induction of labor [Z34.90] ?Intrauterine pregnancy: [redacted]w[redacted]d    ?Secondary diagnosis:  Principal Problem: ?  Vaginal delivery ?Active Problems: ?  Supervision of normal pregnancy, antepartum ? ?Additional problems: None    ?Discharge diagnosis: Term Pregnancy Delivered                                              ?Post partum procedures: Nexplanon placement ?Augmentation: AROM ?Complications: None ? ?Hospital course: Onset of Labor With Vaginal Delivery      ?31y.o. yo [id number] 390w5das admitted in Active Labor on 04/12/2021. Patient had an uncomplicated labor course and vaginal delivery.  ?Membrane Rupture Time/Date: 3:29 PM ,04/12/2021   ?Delivery Method:Vaginal, Spontaneous  ?Episiotomy: None  ?Lacerations:  None  ?Patient had an uncomplicated postpartum course.  She is ambulating, tolerating a regular diet, passing flatus, and urinating well.  Her pain and bleeding are well controlled.  She is breast feeding and supplementing with formula.  Patient is discharged home in stable condition on 04/13/21. ? ?Newborn Data: ?Birth date:04/12/2021  ?Birth time:4:33 PM  ?Gender:Female  ?Living status:Living  ?Apgars:9 ,9  ?Weight:3175 g  ? ?Magnesium Sulfate received: No ?BMZ received: No ?Rhophylac: N/A ?MMR: N/A ?T-DaP: Given prenatally ?Flu: Yes ?Transfusion: No ? ?Physical exam  ?Vitals:  ? 04/12/21 1820 04/13/21 0013 04/13/21 0408 04/13/21 [id number]?BP: 130/74 109/75 113/76 127/69  ?Pulse: 76 75 81 84  ?Resp: _0 ?Temp: (!) 97.5 ?F (36.4 ?C) 97.6 ?F (36.4 ?C) 97.8 ?F (36.6 ?C) 98 ?F (36.7 ?C)  ?TempSrc: Oral Oral Oral Oral  ?SpO2: 100% 100% 100% 100%  ?Weight:      ?Height:      ? ?General: alert, cooperative, and no distress ?Lochia:  appropriate ?Uterine Fundus: firm and below umbilicus  ?DVT Evaluation: no LE edema or calf tenderness to palpation ? ?Labs: ?Lab Results  ?Component Value Date  ? WBC 14.2 (H) 04/12/2021  ? HGB 11.7 (L) 04/12/2021  ? HCT 35.8 (L) 04/12/2021  ? MCV 87.3 04/12/2021  ? PLT 199 04/12/2021  ? ? ?  Latest Ref Rng & Units 06/22/2016  ? 10:38 AM  ?CMP  ?Glucose 65 - 99 mg/dL 80    ?BUN 6 - 20 mg/dL 4    ?Creatinine 0.57 - 1.00 mg/dL 0.60    ?Sodium 134 - 144 mmol/L 137    ?Potassium 3.5 - 5.2 mmol/L 4.3    ?Chloride 96 - 106 mmol/L 105    ?CO2 20 - 29 mmol/L 17    ?Calcium 8.7 - 10.2 mg/dL 8.6    ?Total Protein 6.0 - 8.5 g/dL 5.6    ?Total Bilirubin 0.0 - 1.2 mg/dL <0.2    ?Alkaline Phos 39 - 117 IU/L 54    ?AST 0 - 40 IU/L 8    ?ALT 0 - 32 IU/L 10    ? ?Edinburgh Score: ? ?  04/13/2021  ?  9:27 AM  ?EdFlavia Shipperostnatal Depression Scale Screening Tool  ?I have been able to laugh and see the funny side of things. 0  ?I have looked forward with enjoyment to things.  0  ?I have blamed myself unnecessarily when things went wrong. 1  ?I have been anxious or worried for no good reason. 2  ?I have felt scared or panicky for no good reason. 1  ?Things have been getting on top of me. 0  ?I have been so unhappy that I have had difficulty sleeping. 0  ?I have felt sad or miserable. 0  ?I have been so unhappy that I have been crying. 0  ?The thought of harming myself has occurred to me. 0  ?Edinburgh Postnatal Depression Scale Total 4  ? ? ? ?After visit meds:  ?Allergies as of 04/13/2021   ? ?   Reactions  ? Hydrocodone Nausea And Vomiting  ? ?  ? ?  ?Medication List  ?  ? ?STOP taking these medications   ? ?Gojji Weight Scale Misc ?  ?pantoprazole 40 MG tablet ?Commonly known as: Protonix ?  ? ?  ? ?TAKE these medications   ? ?acetaminophen 500 MG tablet ?Commonly known as: TYLENOL ?Take 2 tablets (1,000 mg total) by mouth every 8 (eight) hours as needed (pain). ?What changed:  ?medication strength ?how much to take ?when to take  this ?reasons to take this ?  ?Blood Pressure Kit Devi ?1 kit by Does not apply route once a week. Check Blood Pressure regularly and record readings into the Babyscripts App.  Large Cuff.  DX O90.0 ?  ?[place] ?Wear daily when ambulating ?  ?ibuprofen 600 MG tablet ?Commonly known as: ADVIL ?Take 1 tablet (600 mg total) by mouth every 6 (six) hours as needed (pain). ?  ?Prenate Mini 29-0.6-0.4-350 MG Caps ?Take 1 capsule by mouth daily before breakfast. ?  ? ?  ? ? ? ?Discharge home in stable condition ?Infant Feeding: Bottle and Breast ?Infant Disposition: home with mother ?Discharge instruction: per After Visit Summary and Postpartum booklet. ?Activity: Advance as tolerated. Pelvic rest for 6 weeks.  ?Diet: routine diet ?Future Appointments: ? ?Follow up Visit: ? Follow-up Information   ? ? [facility] Follow up in 4 week(s).   ?Why: Postpartum appointment ?Contact information: ?[address] ?[place] [address] ?[phone] ? ?  ?  ? ?  ?  ? ?  ? ? ?04/12/21  Caren Macadam, MD  [facility] ?Please schedule this patient for Postpartum visit in: 4 weeks with the following provider: Any provider  ?In-Person  ?For C/S patients schedule nurse incision check in weeks 2 weeks: No  ?Low risk pregnancy complicated by: None  ?Delivery mode:  SVD  ?Anticipated Birth Control:  Nexplanon PP ? ? ?04/13/2021 ?Genia Del, MD ? ? ? ?

## 2021-04-12 NOTE — H&P (Signed)
Obstetric History and Physical ? ?Kathleen Chapman is a 31 y.o. [id number] with IUP at 41w5dpresenting for spontaneous onset of labor. Patient states she has been having  regular, every 1-3 minutes contractions starting around 11 am today,  no  vaginal bleeding, intact membranes, with active fetal movement.   ? ?Prenatal Course ?Source of Care: [facility]  ?Pregnancy complications or risks: ?Patient Active Problem List  ? Diagnosis Date Noted  ? Supervision of normal pregnancy, antepartum 11/17/2020  ? ? ?Office Location  [place]  LMP c/w 19wk sono  ?Language   ENGLISH Anatomy [place]   ?Flu Vaccine  DECLINED 11/17/20 Genetic/Carrier Screen  NIPS: low risk female    ?AFP:   neg ?Horizon: 2 copies of SNM gene  ?TDaP Vaccine   02/05/21 Hgb A1C or  ?GTT Early  ?Third trimester Nml 2 hr GTT  ?COVID Vaccine  NO   LAB RESULTS   ?Rhogam   Blood Type AB/Positive/-- (11/14 1530)   ?Baby Feeding Plan  BREASTFEED Antibody Negative (11/14 1530)  ?Contraception  Nexplanon Rubella 1.31 (11/14 1530)  ?Circumcision  N/A RPR Non Reactive (02/02 0843)   ?Pediatrician   UNSURE HBsAg Negative (11/14 1530)   ?Support Person  FOB HCVAb Negative   ?Prenatal Classes  HIV Non Reactive (02/02 0843)     ?BTL Consent 01/27/2021 GBS NEGATIVE/-- (03/10 2044)  ?VBAC Consent  Pap  negative (11/17/20)  ? ?Past Medical History:  ?Diagnosis Date  ? Anemia   ? GERD (gastroesophageal reflux disease)   ? ? ?Past Surgical History:  ?Procedure Laterality Date  ? NO PAST SURGERIES    ? ? ?OB History  ?Gravida Para Term Preterm AB Living  ?'5 3 3   1 3  ' ?SAB IAB Ectopic Multiple Live Births  ?1     0 3  ?  ?# Outcome Date GA Lbr Len/2nd Weight Sex Delivery Anes PTL Lv  ?5 Current           ?4 Term 10/28/16 354w4d4:46 / 00:18 3286 g F Vag-Spont None  LIV  ?3 Term 06/20/15 385w3d:10 / 00:04 2800 g F Vag-Spont None  LIV  ?2 SAB 10/30/14          ?1 Term 12/27/08 41w41w0d Vag-Spont   LIV  ? ? ?Social History  ? ?Socioeconomic History  ? Marital status: Single   ?  Spouse name: Not on file  ? Number of children: Not on file  ? Years of education: Not on file  ? Highest education level: Not on file  ?Occupational History  ? Not on file  ?Tobacco Use  ? Smoking status: Former  ?  Packs/day: 0.50  ?  Types: Cigarettes  ?  Quit date: 10/28/2014  ?  Years since quitting: 6.4  ? Smokeless tobacco: Never  ?Vaping Use  ? Vaping Use: Never used  ?Substance and Sexual Activity  ? Alcohol use: No  ?  Comment: socially  ? Drug use: No  ? Sexual activity: Yes  ?  Partners: Male  ?  Birth control/protection: None  ?Other Topics Concern  ? Not on file  ?Social History Narrative  ? Not on file  ? ?Social Determinants of Health  ? ?Financial Resource Strain: Not on file  ?Food Insecurity: Not on file  ?Transportation Needs: Not on file  ?Physical Activity: Not on file  ?Stress: Not on file  ?Social Connections: Not on file  ? ? ?Family History  ?Problem Relation  Age of Onset  ? Diabetes Maternal Grandmother   ? Diabetes Maternal Grandfather   ? Diabetes Paternal Grandmother   ? Diabetes Paternal Grandfather   ? Hypertension Mother   ? ? ?Medications Prior to Admission  ?Medication Sig Dispense Refill Last Dose  ? pantoprazole (PROTONIX) 40 MG tablet Take 1 tablet (40 mg total) by mouth daily. 30 tablet 2 04/12/2021  ? Prenat w/o A-FeCbn-Meth-FA-DHA (PRENATE MINI) 29-0.6-0.4-350 MG CAPS Take 1 capsule by mouth daily before breakfast. 90 capsule 4 04/12/2021  ? acetaminophen (TYLENOL) 325 MG tablet Take 650 mg by mouth every 6 (six) hours as needed for mild pain or headache. (Patient not taking: Reported on 03/19/2021)     ? Blood Pressure Monitoring (BLOOD PRESSURE KIT) DEVI 1 kit by Does not apply route once a week. Check Blood Pressure regularly and record readings into the Babyscripts App.  Large Cuff.  DX O90.0 1 each 0   ? Elastic Bandages & Supports (COMFORT FIT MATERNITY SUPP MED) MISC Wear daily when ambulating 1 each 0   ? Misc. Devices (GOJJI WEIGHT SCALE) MISC 1 Device by Does not  apply route once a week. CHECK WEIGHT WEEKLY AND RECORD INFO IN THE BABY SCRIPTS APP. 1 each 0   ? ? ?Allergies  ?Allergen Reactions  ? Hydrocodone Nausea And Vomiting  ? ? ?Review of Systems: Negative except for what is mentioned in HPI. ? ?Physical Exam: ?BP 123/78   Pulse 100   Temp 98 ?F (36.7 ?C) (Oral)   Resp 16   Ht 5' (1.524 m)   Wt 82.1 kg   LMP 07/15/2020   SpO2 100%   BMI 35.37 kg/m?  ?CONSTITUTIONAL: Well-developed, well-nourished female in no acute distress.  ?HENT:  Normocephalic, atraumatic, External right and left ear normal. Oropharynx is clear and moist ?EYES: Conjunctivae and EOM are normal. Pupils are equal, round, and reactive to light. No scleral icterus.  ?NECK: Normal range of motion, supple, no masses ?SKIN: Skin is warm and dry. No rash noted. Not diaphoretic. No erythema. No pallor. ?NEUROLOGIC: Alert and oriented to person, place, and time. Normal reflexes, muscle tone coordination. No cranial nerve deficit noted. ?PSYCHIATRIC: Normal mood and affect. Normal behavior. Normal judgment and thought content. ?CARDIOVASCULAR: Normal heart rate noted, regular rhythm ?RESPIRATORY: [place] and breath sounds normal, no problems with respiration noted ?ABDOMEN: Soft, nontender, nondistended, gravid. ?MUSCULOSKELETAL: Normal range of motion. No edema and no tenderness. 2+ distal pulses. ? ?Cervical Exam: Dilatation 5 cm   Effacement 70%   Station 1   ?Presentation: cephalic ?FHT:  Baseline rate 145 bpm   Variability moderate  Accelerations present   Decelerations none ?Contractions: Every 1-3 mins ?  ?Pertinent Labs/Studies:   ?Results for orders placed or performed during the hospital encounter of 04/12/21 (from the past 24 hour(s))  ?CBC     Status: Abnormal  ? Collection Time: 04/12/21 12:27 PM  ?Result Value Ref Range  ? WBC 14.2 (H) 4.0 - 10.5 K/uL  ? RBC 4.10 3.87 - 5.11 MIL/uL  ? Hemoglobin 11.7 (L) 12.0 - 15.0 g/dL  ? HCT 35.8 (L) 36.0 - 46.0 %  ? MCV 87.3 80.0 - 100.0 fL  ? MCH 28.5  26.0 - 34.0 pg  ? MCHC 32.7 30.0 - 36.0 g/dL  ? RDW 14.2 11.5 - 15.5 %  ? Platelets 199 150 - 400 K/uL  ? nRBC 0.0 0.0 - 0.2 %  ?Type and screen     Status: None  ? Collection Time: 04/12/21  12:32 PM  ?Result Value Ref Range  ? ABO/RH(D) AB POS   ? Antibody Screen NEG   ? Sample Expiration    ?  04/15/2021,2359 ?Performed at [facility], [place] [address], [place], [place] [address] ?  ? ? ?Assessment : ?Chrishawna Farina is a 31 y.o. [id number] at 67w5dbeing admitted for labor ? ?Plan: ?Labor: Expectant management for now. Augmentation as needed. Analgesia as needed. ?FWB: Reassuring fetal heart tracing.  GBS negative ?Delivery plan: Hopeful for vaginal delivery ? ? ?UVerita Schneiders MD, FACOG ?Obstetrician &Social research officer, government Faculty Practice ?[facility]? ? ? ? ?

## 2021-04-13 ENCOUNTER — Encounter (HOSPITAL_COMMUNITY): Payer: Self-pay | Admitting: Obstetrics and Gynecology

## 2021-04-13 DIAGNOSIS — Z30017 Encounter for initial prescription of implantable subdermal contraceptive: Secondary | ICD-10-CM

## 2021-04-13 MED ORDER — ETONOGESTREL 68 MG ~~LOC~~ IMPL
68.0000 mg | DRUG_IMPLANT | Freq: Once | SUBCUTANEOUS | Status: AC
  Administered 2021-04-13: 68 mg via SUBCUTANEOUS
  Filled 2021-04-13: qty 1

## 2021-04-13 MED ORDER — LIDOCAINE HCL 1 % IJ SOLN
0.0000 mL | Freq: Once | INTRAMUSCULAR | Status: AC | PRN
  Administered 2021-04-13: 3 mL via INTRADERMAL
  Filled 2021-04-13: qty 20

## 2021-04-13 MED ORDER — IBUPROFEN 600 MG PO TABS
600.0000 mg | ORAL_TABLET | Freq: Four times a day (QID) | ORAL | 0 refills | Status: AC | PRN
Start: 2021-04-13 — End: ?

## 2021-04-13 MED ORDER — ACETAMINOPHEN 500 MG PO TABS
1000.0000 mg | ORAL_TABLET | Freq: Three times a day (TID) | ORAL | 0 refills | Status: AC | PRN
Start: 2021-04-13 — End: ?

## 2021-04-13 NOTE — Procedures (Signed)
Post-Placental Nexplanon Insertion Procedure Note ? ?Patient was identified. Informed consent was signed, signed copy in chart. A time-out was performed.   ? ?The insertion site was identified 8-10 cm (3-4 inches) from the medial epicondyle of the humerus and 3-5 cm (1.25-2 inches) posterior to (below) the sulcus (groove) between the biceps and triceps muscles of the patient's left arm and marked. The site was prepped and draped in the usual sterile fashion. Site was prepped with alcohol swab and then injected with 3 cc of 1% lidocaine. ? ?The site was then prepped with betadine. Nexplanon inserter removed from packaging, device confirmed in needle, then inserted full length of needle and withdrawn per handbook instructions. Provider and patient verified presence of the implant by palpation. Insertion site was covered with steristrips/adhesive bandage and pressure bandage. There was minimal blood loss. Patient tolerated procedure well. ? ?Patient was given post procedure instructions and Nexplanon user card with expiration date. Condoms were recommended for STI prevention. Patient was asked to keep the pressure dressing on for 24 hours to minimize bruising and keep the adhesive bandage on for 3-5 days. The patient verbalized understanding of the plan of care and agrees.  ? ?Lot #: [id number] ?Expiration Date: 2024/JUL/01  ? ?Kathleen Field, MD ?

## 2021-04-13 NOTE — Lactation Note (Signed)
This note was copied from a baby's chart. ?Lactation Consultation Note ? ?Patient Name: Kathleen Chapman ?Today's Date: 04/13/2021 ?Reason for consult: Follow-up assessment;Early term 37-38.6wks;Breastfeeding assistance ?Age:30 hours ? ?LC checked with RN, Vivien Rota to see how breastfeeding coming along. RN stated Mom waiting for her milk to come in and mostly formula feeding. ? ?LC talked with Mom and reviewed volume guidelines, providing BF supplementation guide and informing her infant take more of formula if not going to the breast.  ? ?Mom desire to start pumping once she gets home as she did with other children.  ? ?Mom remains prn and will call for assistance with lactation as needed.  ? ?Maternal Data ?  ? ?Feeding ?Mother's Current Feeding Choice: Breast Milk and Formula ? ?LATCH Score ?  ? ?  ? ?  ? ?  ? ?  ? ?  ? ? ?Lactation Tools Discussed/Used ?  ? ?Interventions ?Interventions: Breast feeding basics reviewed;Hand express;Expressed milk;Education;Infant Driven Feeding Algorithm education ? ?Discharge ?  ? ?Consult Status ?Consult Status: Follow-up ?Date: 04/14/21 ?Follow-up type: In-patient ? ? ? ?Jadin Creque  Nicholson-Springer ?04/13/2021, 2:34 PM ? ? ? ?

## 2021-04-14 ENCOUNTER — Inpatient Hospital Stay (HOSPITAL_COMMUNITY)
Admission: AD | Admit: 2021-04-14 | Payer: Medicaid Other | Source: Home / Self Care | Admitting: Obstetrics and Gynecology

## 2021-04-14 ENCOUNTER — Inpatient Hospital Stay (HOSPITAL_COMMUNITY): Payer: Medicaid Other

## 2021-04-24 ENCOUNTER — Telehealth (HOSPITAL_COMMUNITY): Payer: Self-pay | Admitting: *Deleted

## 2021-04-24 NOTE — Telephone Encounter (Signed)
Left phone voicemail message. ? ?Duffy Rhody, RN 04-24-2021 at 9:36am ?

## 2021-05-27 ENCOUNTER — Ambulatory Visit: Payer: Medicaid Other | Admitting: Obstetrics

## 2023-07-12 ENCOUNTER — Telehealth: Payer: Self-pay | Admitting: Emergency Medicine

## 2023-07-12 ENCOUNTER — Encounter: Payer: Self-pay | Admitting: Emergency Medicine

## 2023-07-12 ENCOUNTER — Ambulatory Visit: Payer: PRIVATE HEALTH INSURANCE

## 2023-07-12 ENCOUNTER — Ambulatory Visit (HOSPITAL_COMMUNITY): Payer: Self-pay

## 2023-07-12 ENCOUNTER — Ambulatory Visit
Admission: EM | Admit: 2023-07-12 | Discharge: 2023-07-12 | Disposition: A | Discharge status: Home / Self Care | Payer: PRIVATE HEALTH INSURANCE | Attending: Emergency Medicine | Admitting: Emergency Medicine

## 2023-07-12 DIAGNOSIS — L02411 Cutaneous abscess of right axilla: Secondary | ICD-10-CM

## 2023-07-12 DIAGNOSIS — S6992XA Unspecified injury of left wrist, hand and finger(s), initial encounter: Secondary | ICD-10-CM | POA: Diagnosis not present

## 2023-07-12 MED ORDER — CEPHALEXIN 500 MG PO CAPS: 500.0000 mg | ORAL_CAPSULE | Freq: Four times a day (QID) | ORAL | 0 refills | Status: AC

## 2023-07-12 MED ORDER — PREDNISONE 10 MG (21) PO TBPK: ORAL_TABLET | Freq: Every day | ORAL | 0 refills | Status: AC

## 2023-07-12 NOTE — ED Triage Notes (Addendum)
 Pt presents c/o abscess on right armpit x 1 week. Pt says the area is painful and growing in size. Pt also c/o left thumb injury x 2 weeks.

## 2023-07-12 NOTE — Telephone Encounter (Signed)
 Patient left voicemail to call back clinic, did return phone call within the next hour, reported x-ray results, 2 patient identifiers used, no change in treatment plan, advised follow-up with orthopedics if no improvement

## 2023-07-12 NOTE — ED Provider Notes (Signed)
 [facility]    CSN: [id number] Arrival date & time: 07/12/23  1058      History   Chief Complaint Chief Complaint  Patient presents with   Abscess    HPI Kathleen Chapman is a 33 y.o. female.   Patient presents for evaluation of abscess under the right axilla present for 1 weeks.  It has increased in size and become painful.  Denies fever or drainage.  First occurrence, denies recent shaving.  Has not attempted treatment.   Patient endorses an injury to the left arm beginning 2 weeks ago after it was crushed.  Endorses a persistent popping with movement and pain and swelling at the bottom of the finger, endorses that it feels dislocated.  Intermittently experiencing numbness and tingling.  Has not attempted treatment.  Past Medical History:  Diagnosis Date   Anemia    GERD (gastroesophageal reflux disease)     Patient Active Problem List   Diagnosis Date Noted   Insertion of Nexplanon     Vaginal delivery 04/12/2021   Supervision of normal pregnancy, antepartum 11/17/2020    Past Surgical History:  Procedure Laterality Date   NO PAST SURGERIES      OB History     Gravida  5   Para  4   Term  4   Preterm      AB  1   Living  4      SAB  1   IAB      Ectopic      Multiple  0   Live Births  4            Home Medications    Prior to Admission medications   Medication Sig Start Date End Date Taking? Authorizing Provider  acetaminophen  (TYLENOL ) 500 MG tablet Take 2 tablets (1,000 mg total) by mouth every 8 (eight) hours as needed (pain). 04/13/21   Clem Tawni HERO, MD  Blood Pressure Monitoring (BLOOD PRESSURE KIT) DEVI 1 kit by Does not apply route once a week. Check Blood Pressure regularly and record readings into the Babyscripts App.  Large Cuff.  DX O90.0 11/17/20   Constant, Winton, MD  Elastic Bandages & Supports (COMFORT FIT MATERNITY SUPP MED) MISC Wear daily when ambulating 01/22/21   Constant, Peggy, MD  ibuprofen   (ADVIL ) 600 MG tablet Take 1 tablet (600 mg total) by mouth every 6 (six) hours as needed (pain). 04/13/21   Clem Tawni HERO, MD  Prenat w/o A-FeCbn-Meth-FA-DHA (PRENATE MINI ) 29-0.6-0.4-350 MG CAPS Take 1 capsule by mouth daily before breakfast. 07/20/16   Rudy Carlin LABOR, MD    Family History Family History  Problem Relation Age of Onset   Diabetes Maternal Grandmother    Diabetes Maternal Grandfather    Diabetes Paternal Grandmother    Diabetes Paternal Grandfather    Hypertension Mother     Social History Social History   Tobacco Use   Smoking status: Former    Current packs/day: 0.00    Types: Cigarettes    Quit date: 10/28/2014    Years since quitting: 8.7    Passive exposure: Past   Smokeless tobacco: Never  Vaping Use   Vaping status: Never Used  Substance Use Topics   Alcohol use: No    Comment: socially   Drug use: No     Allergies   Hydrocodone   Review of Systems Review of Systems   Physical Exam Triage Vital Signs ED Triage Vitals  Encounter Vitals Group  BP 07/12/23 1125 108/77     Girls Systolic BP Percentile --      Girls Diastolic BP Percentile --      Boys Systolic BP Percentile --      Boys Diastolic BP Percentile --      Pulse Rate 07/12/23 1125 77     Resp 07/12/23 1125 18     Temp 07/12/23 1125 98.6 F (37 C)     Temp Source 07/12/23 1125 Oral     SpO2 07/12/23 1125 97 %     Weight 07/12/23 1124 161 lb (73 kg)     Height --      Head Circumference --      Peak Flow --      Pain Score 07/12/23 1123 6     Pain Loc --      Pain Education --      Exclude from Growth Chart --    No data found.  Updated Vital Signs BP 108/77 (BP Location: Left Arm)   Pulse 77   Temp 98.6 F (37 C) (Oral)   Resp 18   Wt 161 lb (73 kg)   LMP 07/12/2023 (Exact Date)   SpO2 97%   Breastfeeding No   BMI 31.44 kg/m   Visual Acuity Right Eye Distance:   Left Eye Distance:   Bilateral Distance:    Right Eye Near:   Left Eye Near:     Bilateral Near:     Physical Exam Constitutional:      Appearance: Normal appearance.  Eyes:     Extraocular Movements: Extraocular movements intact.  Pulmonary:     [place]: Pulmonary effort is normal.  Musculoskeletal:     Comments: Tenderness present to the middle phalanx of the left thumb without ecchymosis swelling or deformity, appears to be in alignment, sensation intact, capillary refill less than 3, limitations on flexion but able to fully extend  Skin:    Comments: 1 x 2 cm abscess present to the center of the right axilla  Neurological:     Mental Status: She is alert and oriented to person, place, and time. Mental status is at baseline.      UC Treatments / Results  Labs (all labs ordered are listed, but only abnormal results are displayed) Labs Reviewed - No data to display  EKG   Radiology No results found.  Procedures Procedures (including critical care time)  Medications Ordered in UC Medications - No data to display  Initial Impression / Assessment and Plan / UC Course  I have reviewed the triage vital signs and the nursing notes.  Pertinent labs & imaging results that were available during my care of the patient were reviewed by me and considered in my medical decision making (see chart for details).  Abscess of right axilla, injury of left thumb  Due to firmness, deferring I&D, discussed with patient, prescribed cephalexin  recommended warm compresses and over-the-counter analgesics for supportive care, advised to follow-up for nonhealing nondraining site  X-ray negative, will discuss via telephone, prescribed prednisone  as pain has persisted for 2 weeks, recommended supportive care through [facility] with activity as tolerated orthopedic walker referral given  Final Clinical Impressions(s) / UC Diagnoses   Final diagnoses:  None   Discharge Instructions   None    ED Prescriptions   None    PDMP not reviewed this encounter.   Teresa Shelba SAUNDERS, NP 07/12/23 1205

## 2023-07-12 NOTE — Discharge Instructions (Addendum)
-  Your evaluated for the cyst in your arm, on exam it is still very firm and therefore we will not attempt to drain the area today as it will not heal any results -Take cephalexin  every 6 hours for 5 days -Hold warm-hot compresses to affected area at least 4 times a day, this helps to facilitate draining, the more the better -Please return for evaluation for increased swelling, increased tenderness or pain, non healing site, non draining site, you begin to have fever or chills  -We reviewed the etiology of recurrent abscesses of skin.  Skin abscesses are collections of pus within the dermis and deeper skin tissues. Skin abscesses manifest as painful, tender, fluctuant, and erythematous nodules, frequently surmounted by a pustule and surrounded by a rim of erythematous swelling.  Spontaneous drainage of purulent material may occur.  Fever can occur on occasion.   -Skin abscesses can develop in healthy individuals with no predisposing conditions other than skin or nasal carriage of Staphylococcus aureus.  Individuals in close contact with others who have active infection with skin abscesses are at increased risk which is likely to explain why twin brother has similar episodes.   In addition, any process leading to a breach in the skin barrier can also predispose to the development of a skin abscesses, such as atopic dermatitis.    For your thumb - X-ray is pending, you will be notified of results via telephone - If there is a break in the bone then you will follow-up with orthopedics for further evaluation and management - If the thumb is out of alignment we will have you return to the clinic to get it reset - Begin prednisone  every morning with food as directed, this helps with internal inflammation and helps with pain, may take Tylenol  additionally, this will also help with pain and swelling due to your abscess - May apply ice or heat over the affected area 10 to 15-minute intervals - May follow-up with  orthopedics if there is no improvement in symptoms - If there is an injury that is found today on imaging he will follow-up with orthopedics for reevaluation

## 2023-08-25 ENCOUNTER — Emergency Department (HOSPITAL_COMMUNITY)
Admission: EM | Admit: 2023-08-25 | Discharge: 2023-08-25 | Discharge status: Against Medical Advice | Attending: Emergency Medicine | Admitting: Emergency Medicine

## 2023-08-25 ENCOUNTER — Encounter (HOSPITAL_COMMUNITY): Payer: Self-pay | Admitting: Emergency Medicine

## 2023-08-25 DIAGNOSIS — R1030 Lower abdominal pain, unspecified: Secondary | ICD-10-CM | POA: Insufficient documentation

## 2023-08-25 DIAGNOSIS — Z5321 Procedure and treatment not carried out due to patient leaving prior to being seen by health care provider: Secondary | ICD-10-CM | POA: Diagnosis not present

## 2023-08-25 LAB — URINALYSIS, ROUTINE W REFLEX MICROSCOPIC
Bilirubin Urine: NEGATIVE
Glucose, UA: NEGATIVE mg/dL
Ketones, ur: NEGATIVE mg/dL
Leukocytes,Ua: NEGATIVE
Nitrite: NEGATIVE
Protein, ur: 30 mg/dL — AB
Specific Gravity, Urine: 1.023 (ref 1.005–1.030)
pH: 5 (ref 5.0–8.0)

## 2023-08-25 LAB — CBC
HCT: 42.7 % (ref 36.0–46.0)
Hemoglobin: 14.4 g/dL (ref 12.0–15.0)
MCH: 31.2 pg (ref 26.0–34.0)
MCHC: 33.7 g/dL (ref 30.0–36.0)
MCV: 92.4 fL (ref 80.0–100.0)
Platelets: 294 K/uL (ref 150–400)
RBC: 4.62 MIL/uL (ref 3.87–5.11)
RDW: 13.3 % (ref 11.5–15.5)
WBC: 12.4 K/uL — ABNORMAL HIGH (ref 4.0–10.5)
nRBC: 0 % (ref 0.0–0.2)

## 2023-08-25 LAB — HCG, SERUM, QUALITATIVE: Preg, Serum: NEGATIVE

## 2023-08-25 LAB — COMPREHENSIVE METABOLIC PANEL WITH GFR
ALT: 27 U/L (ref 0–44)
AST: 22 U/L (ref 15–41)
Albumin: 3.6 g/dL (ref 3.5–5.0)
Alkaline Phosphatase: 82 U/L (ref 38–126)
Anion gap: 10 (ref 5–15)
BUN: <5 mg/dL — ABNORMAL LOW (ref 6–20)
CO2: 21 mmol/L — ABNORMAL LOW (ref 22–32)
Calcium: 9 mg/dL (ref 8.9–10.3)
Chloride: 108 mmol/L (ref 98–111)
Creatinine, Ser: 0.99 mg/dL (ref 0.44–1.00)
GFR, Estimated: >60 mL/min (ref >60)
Glucose, Bld: 162 mg/dL — ABNORMAL HIGH (ref 70–99)
Potassium: 3.4 mmol/L — ABNORMAL LOW (ref 3.5–5.1)
Sodium: 139 mmol/L (ref 135–145)
Total Bilirubin: 0.6 mg/dL (ref 0.0–1.2)
Total Protein: 7.1 g/dL (ref 6.5–8.1)

## 2023-08-25 LAB — LIPASE, BLOOD: Lipase: 27 U/L (ref 11–51)

## 2023-08-25 MED ORDER — IBUPROFEN 400 MG PO TABS
400.0000 mg | ORAL_TABLET | Freq: Once | ORAL | Status: AC | PRN
  Administered 2023-08-25: 400 mg via ORAL
  Filled 2023-08-25: qty 1

## 2023-08-25 NOTE — ED Notes (Signed)
Called for room no answer x3 

## 2023-08-25 NOTE — ED Triage Notes (Signed)
 Patient c/o n/v/d x 2 days with yellow emesis.

## 2023-08-25 NOTE — ED Notes (Signed)
 Patient stats the pain meds are wearing off and wanting to know if she could get more

## 2023-08-25 NOTE — ED Triage Notes (Signed)
 Pt worried about gastritis flare up. N/V/D for 3 days with yellow emesis. Lower abd pain.

## 2023-09-07 ENCOUNTER — Emergency Department (HOSPITAL_COMMUNITY)

## 2023-09-07 ENCOUNTER — Other Ambulatory Visit: Payer: Self-pay

## 2023-09-07 ENCOUNTER — Emergency Department (HOSPITAL_COMMUNITY)
Admission: EM | Admit: 2023-09-07 | Discharge: 2023-09-07 | Disposition: A | Discharge status: Home / Self Care | Attending: Emergency Medicine | Admitting: Emergency Medicine

## 2023-09-07 ENCOUNTER — Encounter (HOSPITAL_COMMUNITY): Payer: Self-pay

## 2023-09-07 DIAGNOSIS — R519 Headache, unspecified: Secondary | ICD-10-CM | POA: Diagnosis not present

## 2023-09-07 DIAGNOSIS — E119 Type 2 diabetes mellitus without complications: Secondary | ICD-10-CM | POA: Diagnosis not present

## 2023-09-07 DIAGNOSIS — Z720 Tobacco use: Secondary | ICD-10-CM | POA: Diagnosis not present

## 2023-09-07 DIAGNOSIS — R42 Dizziness and giddiness: Secondary | ICD-10-CM | POA: Insufficient documentation

## 2023-09-07 DIAGNOSIS — R1110 Vomiting, unspecified: Secondary | ICD-10-CM | POA: Insufficient documentation

## 2023-09-07 DIAGNOSIS — K529 Noninfective gastroenteritis and colitis, unspecified: Secondary | ICD-10-CM | POA: Insufficient documentation

## 2023-09-07 DIAGNOSIS — I10 Essential (primary) hypertension: Secondary | ICD-10-CM | POA: Insufficient documentation

## 2023-09-07 LAB — CBC WITH DIFFERENTIAL/PLATELET
Abs Immature Granulocytes: 0.06 K/uL (ref 0.00–0.07)
Basophils Absolute: 0.1 K/uL (ref 0.0–0.1)
Basophils Relative: 0 %
Eosinophils Absolute: 0.2 K/uL (ref 0.0–0.5)
Eosinophils Relative: 1 %
HCT: 41.1 % (ref 36.0–46.0)
Hemoglobin: 13.4 g/dL (ref 12.0–15.0)
Immature Granulocytes: 0 %
Lymphocytes Relative: 18 %
Lymphs Abs: 2.5 K/uL (ref 0.7–4.0)
MCH: 30 pg (ref 26.0–34.0)
MCHC: 32.6 g/dL (ref 30.0–36.0)
MCV: 92.2 fL (ref 80.0–100.0)
Monocytes Absolute: 0.5 K/uL (ref 0.1–1.0)
Monocytes Relative: 4 %
Neutro Abs: 10.7 K/uL — ABNORMAL HIGH (ref 1.7–7.7)
Neutrophils Relative %: 77 %
Platelets: 284 K/uL (ref 150–400)
RBC: 4.46 MIL/uL (ref 3.87–5.11)
RDW: 13.9 % (ref 11.5–15.5)
WBC: 14 K/uL — ABNORMAL HIGH (ref 4.0–10.5)
nRBC: 0 % (ref 0.0–0.2)

## 2023-09-07 LAB — URINALYSIS, ROUTINE W REFLEX MICROSCOPIC
Bilirubin Urine: NEGATIVE
Glucose, UA: NEGATIVE mg/dL
Ketones, ur: NEGATIVE mg/dL
Leukocytes,Ua: NEGATIVE
Nitrite: NEGATIVE
Protein, ur: NEGATIVE mg/dL
Specific Gravity, Urine: 1.018 (ref 1.005–1.030)
pH: 5 (ref 5.0–8.0)

## 2023-09-07 LAB — HCG, SERUM, QUALITATIVE: Preg, Serum: NEGATIVE

## 2023-09-07 LAB — COMPREHENSIVE METABOLIC PANEL WITH GFR
ALT: 35 U/L (ref 0–44)
AST: 21 U/L (ref 15–41)
Albumin: 3.7 g/dL (ref 3.5–5.0)
Alkaline Phosphatase: 75 U/L (ref 38–126)
Anion gap: 9 (ref 5–15)
BUN: 7 mg/dL (ref 6–20)
CO2: 25 mmol/L (ref 22–32)
Calcium: 9.1 mg/dL (ref 8.9–10.3)
Chloride: 108 mmol/L (ref 98–111)
Creatinine, Ser: 0.76 mg/dL (ref 0.44–1.00)
GFR, Estimated: >60 mL/min (ref >60)
Glucose, Bld: 95 mg/dL (ref 70–99)
Potassium: 4 mmol/L (ref 3.5–5.1)
Sodium: 142 mmol/L (ref 135–145)
Total Bilirubin: 0.4 mg/dL (ref 0.0–1.2)
Total Protein: 6.3 g/dL — ABNORMAL LOW (ref 6.5–8.1)

## 2023-09-07 MED ORDER — METOCLOPRAMIDE HCL 5 MG/ML IJ SOLN
10.0000 mg | Freq: Once | INTRAMUSCULAR | Status: AC
  Administered 2023-09-07: 10 mg via INTRAVENOUS
  Filled 2023-09-07: qty 2

## 2023-09-07 MED ORDER — SODIUM CHLORIDE 0.9 % IV BOLUS
1000.0000 mL | Freq: Once | INTRAVENOUS | Status: AC
  Administered 2023-09-07: 1000 mL via INTRAVENOUS

## 2023-09-07 MED ORDER — DIPHENHYDRAMINE HCL 50 MG/ML IJ SOLN
12.5000 mg | Freq: Once | INTRAMUSCULAR | Status: AC
  Administered 2023-09-07: 12.5 mg via INTRAVENOUS
  Filled 2023-09-07: qty 1

## 2023-09-07 MED ORDER — KETOROLAC TROMETHAMINE 15 MG/ML IJ SOLN
15.0000 mg | Freq: Once | INTRAMUSCULAR | Status: AC
  Administered 2023-09-07: 15 mg via INTRAVENOUS
  Filled 2023-09-07: qty 1

## 2023-09-07 MED ORDER — MECLIZINE HCL 25 MG PO TABS: 25.0000 mg | ORAL_TABLET | Freq: Three times a day (TID) | ORAL | 0 refills | Status: AC | PRN

## 2023-09-07 MED ORDER — PANTOPRAZOLE SODIUM 40 MG IV SOLR
40.0000 mg | Freq: Once | INTRAVENOUS | Status: AC
  Administered 2023-09-07: 40 mg via INTRAVENOUS
  Filled 2023-09-07: qty 10

## 2023-09-07 MED ORDER — PANTOPRAZOLE SODIUM 40 MG PO TBEC
40.0000 mg | DELAYED_RELEASE_TABLET | Freq: Every day | ORAL | 0 refills | Status: AC
Start: 2023-09-07 — End: ?

## 2023-09-07 MED ORDER — ONDANSETRON 4 MG PO TBDP: 4.0000 mg | ORAL_TABLET | Freq: Three times a day (TID) | ORAL | 0 refills | Status: AC | PRN

## 2023-09-07 NOTE — ED Notes (Signed)
 Pt was given water and ginger ale and saltine crackers, tolerated this well so far and ambulated to the bathroom independently

## 2023-09-07 NOTE — ED Triage Notes (Signed)
 Dizziness that started tonight with 3-4 episodes of vomiting and a headache.

## 2023-09-07 NOTE — ED Provider Notes (Signed)
 [place] [facility] AT [facility] Provider Note   CSN: [id number] Arrival date & time: 09/07/23  9750     Patient presents with: Dizziness   Kathleen Chapman is a 33 y.o. female.   The history is provided by the patient and a significant other.  Dizziness Kathleen Chapman is a 33 y.o. female who presents to the Emergency Department complaining of headache. She presents the emergency department accompanied by her significant other for evaluation of bad headaches that started around midday. Headache is frontal in origin. It is similar to prior headaches but with this headache she has associated dizziness that started around 130 in the morning. She feels like things are spinning around in the room. This is constant feeling. She has associated photophobia. She developed emesis after the dizziness began. She has chronic diarrhea. No associated fever, dysuria, weakness, numbness. She does have mild chronic neck pain, unchanged from baseline. She has no known medical problems and takes no routine medications. She has a family history of breast cancer, diabetes, hypertension. She does use tobacco. No alcohol or drug use. No reported injuries.     Prior to Admission medications   Medication Sig Start Date End Date Taking? Authorizing Provider  meclizine  (ANTIVERT ) 25 MG tablet Take 1 tablet (25 mg total) by mouth 3 (three) times daily as needed for dizziness. 09/07/23  Yes Griselda Norris, MD  ondansetron  (ZOFRAN -ODT) 4 MG disintegrating tablet Take 1 tablet (4 mg total) by mouth every 8 (eight) hours as needed. 09/07/23  Yes Griselda Norris, MD  pantoprazole  (PROTONIX ) 40 MG tablet Take 1 tablet (40 mg total) by mouth daily. 09/07/23  Yes Griselda Norris, MD  acetaminophen  (TYLENOL ) 500 MG tablet Take 2 tablets (1,000 mg total) by mouth every 8 (eight) hours as needed (pain). 04/13/21   Clem Tawni HERO, MD  Blood Pressure Monitoring (BLOOD PRESSURE KIT) DEVI 1 kit by Does not  apply route once a week. Check Blood Pressure regularly and record readings into the [employer] App.  Large Cuff.  DX O90.0 11/17/20   Constant, Peggy, MD  cephALEXin  (KEFLEX ) 500 MG capsule Take 1 capsule (500 mg total) by mouth 4 (four) times daily. 07/12/23   Teresa Shelba SAUNDERS, NP  Elastic Bandages & Supports (COMFORT FIT MATERNITY SUPP MED) MISC Wear daily when ambulating 01/22/21   Constant, Peggy, MD  ibuprofen  (ADVIL ) 600 MG tablet Take 1 tablet (600 mg total) by mouth every 6 (six) hours as needed (pain). 04/13/21   Clem Tawni HERO, MD  predniSONE  (STERAPRED UNI-PAK 21 TAB) 10 MG (21) TBPK tablet Take by mouth daily. Take 6 tabs by mouth daily  for 1 days, then 5 tabs for 1 days, then 4 tabs for 1 days, then 3 tabs for 1 days, 2 tabs for 1 days, then 1 tab by mouth daily for 1 days 07/12/23   Teresa Shelba SAUNDERS, NP  Prenat w/o A-FeCbn-Meth-FA-DHA (PRENATE MINI ) 29-0.6-0.4-350 MG CAPS Take 1 capsule by mouth daily before breakfast. 07/20/16   Rudy Carlin LABOR, MD    Allergies: Hydrocodone    Review of Systems  Neurological:  Positive for dizziness.  All other systems reviewed and are negative.   Updated Vital Signs BP 115/76   Pulse (!) 59   Temp (!) 97.5 F (36.4 C) (Oral)   Resp 18   Ht 5' (1.524 m)   Wt 66.7 kg   LMP 08/12/2023   SpO2 100%   BMI 28.71 kg/m   Physical Exam Vitals and nursing  note reviewed.  Constitutional:      Appearance: She is well-developed.  HENT:     Head: Normocephalic and atraumatic.  Cardiovascular:     Rate and Rhythm: Normal rate and regular rhythm.     Heart sounds: No murmur heard. Pulmonary:     Effort: Pulmonary effort is normal. No respiratory distress.     Breath sounds: Normal breath sounds.  Abdominal:     Palpations: Abdomen is soft.     Tenderness: There is no abdominal tenderness. There is no guarding or rebound.  Musculoskeletal:        General: No tenderness.  Skin:    General: Skin is warm and dry.  Neurological:      Mental Status: She is alert and oriented to person, place, and time.     Comments: No asymmetry of facial movements.  5/5 strength in all four extremities with sensation to light touch intact in all four extremities.    Psychiatric:        Behavior: Behavior normal.     (all labs ordered are listed, but only abnormal results are displayed) Labs Reviewed  COMPREHENSIVE METABOLIC PANEL WITH GFR - Abnormal; Notable for the following components:      Result Value   Total Protein 6.3 (*)    All other components within normal limits  CBC WITH DIFFERENTIAL/PLATELET - Abnormal; Notable for the following components:   WBC 14.0 (*)    Neutro Abs 10.7 (*)    All other components within normal limits  URINALYSIS, ROUTINE W REFLEX MICROSCOPIC - Abnormal; Notable for the following components:   Hgb urine dipstick MODERATE (*)    Bacteria, UA RARE (*)    All other components within normal limits  HCG, SERUM, QUALITATIVE    EKG: None  Radiology: CT Head Wo Contrast Result Date: 09/07/2023 EXAM: CT HEAD WITHOUT CONTRAST 09/07/2023 06:16:24 AM TECHNIQUE: CT of the head was performed without the administration of intravenous contrast. Automated exposure control, iterative reconstruction, and/or weight based adjustment of the mA/kV was utilized to reduce the radiation dose to as low as reasonably achievable. COMPARISON: None available. CLINICAL HISTORY: Headache, sudden, severe. Dizziness that started tonight with 3-4 episodes of vomiting and a headache. FINDINGS: BRAIN AND VENTRICLES: No acute hemorrhage. No evidence of acute infarct. No hydrocephalus. No extra-axial collection. No mass effect or midline shift. ORBITS: No acute abnormality. SINUSES: No acute abnormality. SOFT TISSUES AND SKULL: No acute soft tissue abnormality. No skull fracture. IMPRESSION: 1. No acute intracranial abnormality. Electronically signed by: Evalene Coho MD 09/07/2023 06:18 AM EDT RP Workstation: [id number]      Procedures   Medications Ordered in the ED  sodium chloride  0.9 % bolus 1,000 mL (0 mLs Intravenous Stopped 09/07/23 0656)  metoCLOPramide  (REGLAN ) injection 10 mg (10 mg Intravenous Given 09/07/23 0453)  diphenhydrAMINE  (BENADRYL ) injection 12.5 mg (12.5 mg Intravenous Given 09/07/23 0453)  ketorolac  (TORADOL ) 15 MG/ML injection 15 mg (15 mg Intravenous Given 09/07/23 0702)  pantoprazole  (PROTONIX ) injection 40 mg (40 mg Intravenous Given 09/07/23 0703)                                    Medical Decision Making Amount and/or Complexity of Data Reviewed Labs: ordered. Radiology: ordered.  Risk Prescription drug management.   Patient here for evaluation of headache, vomiting, vertigo. No focal neurologic deficits on examination. She was treated with medications for her headache with significant improvement  in her symptoms. CBC with mild leukocytosis - history of same on record review, suspect this is for baseline. There is no evidence of acute infectious process. CT head is negative for acute abnormality. Current clinical picture is not consistent with acute CVA, meningitis, subarachnoid hemorrhage, dural sinus thrombosis. After treatment in the emergency department her symptoms are significantly improved, able to take PO and ambulate without difficulty. Feels she is stable for discharge home with outpatient follow-up and return precautions.     Final diagnoses:  Bad headache  Vertigo    ED Discharge Orders          Ordered    pantoprazole  (PROTONIX ) 40 MG tablet  Daily        09/07/23 0713    ondansetron  (ZOFRAN -ODT) 4 MG disintegrating tablet  Every 8 hours PRN        09/07/23 0713    meclizine  (ANTIVERT ) 25 MG tablet  3 times daily PRN        09/07/23 0713               Griselda Norris, MD 09/07/23 [phone]
